# Patient Record
Sex: Female | Born: 1975 | Race: White | Hispanic: No | Marital: Married | Smoking: Never smoker
Health system: Southern US, Community
[De-identification: ages and names within clinical notes are randomized; demographics above are authoritative.]

## PROBLEM LIST (undated history)

## (undated) DIAGNOSIS — E109 Type 1 diabetes mellitus without complications: Secondary | ICD-10-CM

## (undated) DIAGNOSIS — E785 Hyperlipidemia, unspecified: Secondary | ICD-10-CM

## (undated) DIAGNOSIS — R51 Headache: Secondary | ICD-10-CM

## (undated) DIAGNOSIS — R519 Headache, unspecified: Secondary | ICD-10-CM

## (undated) HISTORY — DX: Type 1 diabetes mellitus without complications: E10.9

## (undated) HISTORY — DX: Headache: R51

## (undated) HISTORY — DX: Headache, unspecified: R51.9

## (undated) HISTORY — DX: Hyperlipidemia, unspecified: E78.5

---

## 1999-06-17 ENCOUNTER — Other Ambulatory Visit: Admission: RE | Admit: 1999-06-17 | Discharge: 1999-06-17 | Payer: Self-pay | Admitting: Gynecology

## 1999-12-23 ENCOUNTER — Other Ambulatory Visit: Admission: RE | Admit: 1999-12-23 | Discharge: 1999-12-23 | Payer: Self-pay | Admitting: Obstetrics and Gynecology

## 2000-01-19 ENCOUNTER — Encounter: Payer: Self-pay | Admitting: Obstetrics and Gynecology

## 2000-01-19 ENCOUNTER — Ambulatory Visit (HOSPITAL_COMMUNITY): Admission: RE | Admit: 2000-01-19 | Discharge: 2000-01-19 | Payer: Self-pay | Admitting: Obstetrics and Gynecology

## 2000-02-16 ENCOUNTER — Ambulatory Visit (HOSPITAL_COMMUNITY): Admission: RE | Admit: 2000-02-16 | Discharge: 2000-02-16 | Payer: Self-pay | Admitting: Obstetrics and Gynecology

## 2000-02-16 ENCOUNTER — Encounter: Payer: Self-pay | Admitting: Obstetrics and Gynecology

## 2000-06-22 ENCOUNTER — Inpatient Hospital Stay (HOSPITAL_COMMUNITY): Admission: AD | Admit: 2000-06-22 | Discharge: 2000-06-22 | Payer: Self-pay | Admitting: Obstetrics and Gynecology

## 2000-06-22 ENCOUNTER — Encounter: Payer: Self-pay | Admitting: Obstetrics and Gynecology

## 2000-06-23 ENCOUNTER — Encounter (INDEPENDENT_AMBULATORY_CARE_PROVIDER_SITE_OTHER): Payer: Self-pay

## 2000-06-23 ENCOUNTER — Inpatient Hospital Stay (HOSPITAL_COMMUNITY): Admission: AD | Admit: 2000-06-23 | Discharge: 2000-06-26 | Payer: Self-pay | Admitting: Obstetrics and Gynecology

## 2000-06-28 ENCOUNTER — Encounter: Admission: RE | Admit: 2000-06-28 | Discharge: 2000-09-26 | Payer: Self-pay | Admitting: Obstetrics and Gynecology

## 2000-12-25 ENCOUNTER — Other Ambulatory Visit: Admission: RE | Admit: 2000-12-25 | Discharge: 2000-12-25 | Payer: Self-pay | Admitting: Obstetrics and Gynecology

## 2002-01-15 ENCOUNTER — Other Ambulatory Visit: Admission: RE | Admit: 2002-01-15 | Discharge: 2002-01-15 | Payer: Self-pay | Admitting: Obstetrics and Gynecology

## 2002-08-02 ENCOUNTER — Encounter: Payer: Self-pay | Admitting: Obstetrics and Gynecology

## 2002-08-02 ENCOUNTER — Ambulatory Visit (HOSPITAL_COMMUNITY): Admission: RE | Admit: 2002-08-02 | Discharge: 2002-08-02 | Payer: Self-pay | Admitting: Obstetrics and Gynecology

## 2002-12-27 ENCOUNTER — Inpatient Hospital Stay (HOSPITAL_COMMUNITY): Admission: RE | Admit: 2002-12-27 | Discharge: 2002-12-30 | Payer: Self-pay | Admitting: Obstetrics and Gynecology

## 2003-02-07 ENCOUNTER — Other Ambulatory Visit: Admission: RE | Admit: 2003-02-07 | Discharge: 2003-02-07 | Payer: Self-pay | Admitting: Obstetrics and Gynecology

## 2004-02-27 ENCOUNTER — Other Ambulatory Visit: Admission: RE | Admit: 2004-02-27 | Discharge: 2004-02-27 | Payer: Self-pay | Admitting: Obstetrics and Gynecology

## 2005-03-14 ENCOUNTER — Other Ambulatory Visit: Admission: RE | Admit: 2005-03-14 | Discharge: 2005-03-14 | Payer: Self-pay | Admitting: Obstetrics and Gynecology

## 2005-12-19 ENCOUNTER — Encounter: Admission: RE | Admit: 2005-12-19 | Discharge: 2005-12-27 | Payer: Self-pay | Admitting: Obstetrics and Gynecology

## 2006-03-08 ENCOUNTER — Inpatient Hospital Stay (HOSPITAL_COMMUNITY): Admission: RE | Admit: 2006-03-08 | Discharge: 2006-03-11 | Payer: Self-pay | Admitting: Obstetrics and Gynecology

## 2007-10-19 ENCOUNTER — Encounter (INDEPENDENT_AMBULATORY_CARE_PROVIDER_SITE_OTHER): Payer: Self-pay | Admitting: Obstetrics and Gynecology

## 2007-10-19 ENCOUNTER — Inpatient Hospital Stay (HOSPITAL_COMMUNITY): Admission: RE | Admit: 2007-10-19 | Discharge: 2007-10-22 | Payer: Self-pay | Admitting: Obstetrics and Gynecology

## 2009-03-18 ENCOUNTER — Emergency Department (HOSPITAL_BASED_OUTPATIENT_CLINIC_OR_DEPARTMENT_OTHER): Admission: EM | Admit: 2009-03-18 | Discharge: 2009-03-18 | Payer: Self-pay | Admitting: Emergency Medicine

## 2009-03-19 ENCOUNTER — Emergency Department (HOSPITAL_BASED_OUTPATIENT_CLINIC_OR_DEPARTMENT_OTHER): Admission: EM | Admit: 2009-03-19 | Discharge: 2009-03-19 | Payer: Self-pay | Admitting: Emergency Medicine

## 2010-11-09 NOTE — Discharge Summary (Signed)
Ellen [name], Ellen [name]                 ACCOUNT NO.:  [id number]   MEDICAL RECORD NO.:  [id number]          PATIENT TYPE:  INP   LOCATION:  9148                          FACILITY:  [facility]   PHYSICIAN:  Zenaida Niece, M.D.DATE OF BIRTH:  1975/11/29   DATE OF ADMISSION:  10/19/2007  DATE OF DISCHARGE:  10/22/2007                               DISCHARGE SUMMARY   ADMISSION DIAGNOSES:  1. Intrauterine pregnancy at 39 weeks.  2. Diabetes.  3. Desires surgical sterility.   DISCHARGE DIAGNOSES:  1. Intrauterine pregnancy at 39 weeks.  2. Diabetes.  3. Desires surgical sterility.   PROCEDURES:  On October 19, 2007, she had repeat low transverse cesarean  section and bilateral partial salpingectomy.   HISTORY AND PHYSICAL:  Please see chart for full history and physical.  Briefly this is a 35 year old white female gravida 4, para 3-0-0-3 with  an EGA of [redacted] weeks, who presents for a repeat cesarean section.  She has  had three prior low transverse cesarean sections.  Pregnancy complicated  by diabetes with adequate control with insulin, and she desires tubal  ligation.  See chart for the remainder of her history.   PHYSICAL EXAMINATION:  Significant for weight of 200 pounds, gravid  abdomen with fundal height of 39.5 cm and a well-healed transverse scar.  The cervix is fingertip thick and -3 in vertex.   HOSPITAL COURSE:  The patient was admitted on the day of surgery,  underwent repeat cesarean section and tubal ligation under spinal  anesthesia.   ESTIMATED BLOOD LOSS:  1000 mL.   She delivered a viable female infant with Apgars of 9 and 9, and weighed 9  pounds and 12 ounces.  Postoperatively, she had no significant  complications.  Her blood sugars were well controlled with a reduced  insulin regimen.  Preoperative hemoglobin 11.5 and postoperative is  10.1.  On postoperative day #3, she was felt to be stable enough for  discharge home.  Her incision is healing well and her staples  were  removed, and Steri-Strips applied.   DISCHARGE INSTRUCTIONS:  Diabetic diet, pelvic rest, no strenuous  activity.  Follow up is in 2 weeks for an incision check.   MEDICATIONS:  1. Percocet, #30, one to two p.o. q. 4-6 hours p.r.n. pain and over-      the-counter ibuprofen as needed.  2. She is to take Lantus insulin 10 units subcu nightly and then she      is on Humalog regimen with meals, but she titrates herself.  She is      also given our discharge pamphlet.      Zenaida Niece, M.D.  Electronically Signed     TDM/MEDQ  D:  10/22/2007  T:  10/22/2007  Job:  [id number]

## 2010-11-09 NOTE — H&P (Signed)
Ellen [name], Ellen [name]                 ACCOUNT NO.:  [id number]   MEDICAL RECORD NO.:  [id number]          PATIENT TYPE:  INP   LOCATION:  NA                            FACILITY:  [facility]   PHYSICIAN:  Ellen [name], M.D.DATE OF BIRTH:  September 18, 1975   DATE OF ADMISSION:  10/19/2007  DATE OF DISCHARGE:                              HISTORY & PHYSICAL   CHIEF COMPLAINT:  Repeat cesarean section.   HISTORY OF PRESENT ILLNESS:  This is a 35 year old white female gravida  4, para 3-0-0-3 with an EGA of [redacted] weeks by an LMP consistent with an 8-  week ultrasound with a due date of Oct 26, 2007, who presents for repeat  cesarean section.  She has had three prior low transverse cesarean  sections and is undergoing repeat cesarean section.  Prenatal care has  been complicated by diabetes which has been requiring insulin.  She was  initially being followed by Dr. Talmage [name] but was not able to afford this  during pregnancy, so we have been following her.  During her pregnancy,  her sugars have been not optimally controlled but fairly adequately  controlled.  She has had reactive nonstress tests and no other  complications with the pregnancy.  She does wish to undergo tubal  ligation.   PRENATAL LABORATORY DATA:  Blood type is AB positive with a negative  antibody screen.  RPR nonreactive, rubella immune, hepatitis B surface  antigen negative, HIV negative, gonorrhea and chlamydia negative.  Cystic fibrosis negative.  Group B strep positive.   PAST OBSTETRICAL HISTORY:  Three cesarean sections at term, the first  one in 2001, that baby weighed 7 pounds 9 ounces, that was done for  nonreassuring fetal heart tracing.  She had oligohydramnios and  meconium.  The second C-section in 2004.  Baby weighed 9 pounds 12  ounces, that was a repeat and in 2007 another repeat at 39 weeks, 8  pounds 6 ounces.  She had gestational diabetes with the second pregnancy  but no other diabetes until this pregnancy.   Recent  diagnosis of diabetes for which she is on insulin.  She has a  remote history of pyelonephritis.   PAST SURGICAL HISTORY:  Cesarean section x3 and wisdom tooth removal.   ALLERGIES:  None known.   CURRENT MEDICATIONS:  Lantus 22 units daily as well as a kind of sliding  scale of Humalog currently with 6 units with breakfast, 10 at lunch and  12-14 with dinner.   SOCIAL HISTORY:  She is married and denies alcohol, tobacco or drug use.   PHYSICAL EXAMINATION:  GENERAL APPEARANCE:  This is a well-developed,  gravid female in no acute distress.  Weight is 200 pounds.  NECK:  Supple without lymphadenopathy or thyromegaly.  LUNGS:  Clear to auscultation.  HEART:  Regular rate and rhythm without murmur.  ABDOMEN:  Gravid with fundal height of 39.5 cm and a well-healed  transverse scar.  EXTREMITIES:  Extremities have 1+ edema and are nontender.  PELVIC:  Cervix is fingertip, thick and -3 vertex presentation.   ASSESSMENT:  1. Intrauterine  pregnancy at 39 weeks with three previous cesarean      sections.  The patient is being admitted for repeat cesarean      section.  Surgery and all risks of surgery have been discussed and      she understands.  2. Desires surgical sterility.  She understands that this is a      permanent procedure with a 1/150 to 1/200 failure rate.  3. Diabetes under adequate control on insulin.   PLAN:  Admit the patient on the day of surgery for repeat cesarean  section and tubal ligation.      Ellen [name], M.D.  Electronically Signed     TDM/MEDQ  D:  10/18/2007  T:  10/18/2007  Job:  [id number]

## 2010-11-09 NOTE — Op Note (Signed)
Ellen [name], Ellen [name]                 ACCOUNT NO.:  [id number]   MEDICAL RECORD NO.:  [id number]          PATIENT TYPE:  INP   LOCATION:  9199                          FACILITY:  [facility]   PHYSICIAN:  Leighton Roach Meisinger, M.D.DATE OF BIRTH:  1976/04/07   DATE OF PROCEDURE:  10/19/2007  DATE OF DISCHARGE:                               OPERATIVE REPORT   PREOPERATIVE AND POSTOPERATIVE DIAGNOSIS:  Intrauterine pregnancy at 39  weeks, previous cesarean section x3 and desires surgical sterility.   PROCEDURE:  Repeat low transverse cesarean section and bilateral partial  salpingectomy.   SURGEON:  Zenaida Niece, M.D.   ASSISTANT:  Tracey Harries, MD   ANESTHESIA:  Epidural.   FINDINGS:  She had abdominal wall scarring from her prior cesarean  sections.  She delivered a viable female infant with Apgars of 9 and 9  that weighed 9 pounds 12 ounces.   SPECIMENS:  Placenta sent for cord blood collection and then to  pathology.   ESTIMATED BLOOD LOSS:  1000 mL.   COMPLICATIONS:  None.   PROCEDURE IN DETAIL:  The patient was taken to the operating room and  placed in the sitting position.  Dr. Tacy Dura instilled spinal anesthesia  and she was then placed in the dorsal supine position with left lateral  tilt.  Abdomen was then prepped and draped in the usual sterile fashion  and a Foley catheter inserted.  The level of her anesthesia was found to  be adequate and abdomen was entered via a standard Pfannenstiel's  incision through her previous scar.  There was noted to be fair amount  of abdominal wall scarring and this was taken down carefully in layers.  Once the peritoneal cavity was entered and the lower uterine segment was  exposed, an Alexis disposable self-retaining retractor was placed.  This  achieved good exposure of the lower uterine segment.  A 4 cm transverse  incision was made in a fairly thin lower uterine segment.  The incision  was extended digitally.  Membranes were ruptured  revealing clear fluid.  The fetal vertex was brought to the incision.  It was very difficult to  deliver just due to the size of the head.  I tried the Kiwi vacuum twice  for assistance and another vacuum once for assistance and they did not  achieve adequate suction and all popped off with moderate traction.  The  Alexis retractor was removed and the rectus muscles on the patient's  right side were divided to achieve better exposure.  Once this was done,  the vertex was delivered.  Mouth and nares were suctioned.  A loose  nuchal cord times was reduced.  The remainder of the infant then  delivered atraumatically.  Cord was doubly clamped and cut and the  infant handed to the awaiting pediatric team.  The placenta delivered  spontaneously and was sent for cord blood collection.  Uterus was wiped  dry with a clean lap pad, all clots and debris removed.  Uterine  incision was inspected and found to be free of extensions.  Uterine  incision was  closed in one layer being a running locking layer with #1  chromic with adequate hemostasis.  Tubes and ovaries were inspected and  found to be normal.  Uterine incision was again inspected and found to  be hemostatic.  Attention was turned to tubal ligation.  Both fallopian  tubes and ovaries were normal.  Both fallopian tubes were identified and  traced to their fimbriated ends.  The middle portion of each fallopian  tube was elevated with a Babcock clamp.  A window was made in an  avascular portion of the mesosalpinx with electrocautery.  Zero plain  gut suture was passed through this window, tied proximally and wrapped  around distally to create a knuckle of tube.  On both sides these  knuckles of tubes were removed sharply.  A small amount of bleeding from  one of the stumps on the patient's right side was controlled with  electrocautery.  Other than that, the stumps were hemostatic and tubal  ostia were identified.  Uterine incision was again  inspected and found  to be hemostatic.  Subfascial space was then irrigated and made  hemostatic with electrocautery.  Rectus muscles were reapproximated in  the midline with running number 1 chromic which also reapproximated the  rectus muscle on the right that had been cut.  Fascia was then closed in  running fashion starting at both ends and meeting in the middle with 0  Vicryl.  Subcutaneous tissue was irrigated and made hemostatic with  electrocautery.  Subcutaneous tissue was then closed with running 2-0  plain gut suture.  Skin was then closed with staples followed by a  sterile dressing.  The patient tolerated the procedure well.  She was  taken to the recovery room in stable condition.  Counts were correct x2,  she was given Ancef 1 gram IV at the beginning of the procedure and had  PAS hose on throughout the procedure.      Zenaida Niece, M.D.  Electronically Signed     TDM/MEDQ  D:  10/19/2007  T:  10/19/2007  Job:  [id number]

## 2010-11-12 NOTE — Op Note (Signed)
Ellen [name], Ellen [name]                             ACCOUNT NO.:  [id number]   MEDICAL RECORD NO.:  [id number]                   PATIENT TYPE:  INP   LOCATION:  9145                                 FACILITY:  [facility]   PHYSICIAN:  Zenaida Niece, M.D.             DATE OF BIRTH:  02-Nov-1975   DATE OF PROCEDURE:  12/27/2002  DATE OF DISCHARGE:                                 OPERATIVE REPORT   PREOPERATIVE DIAGNOSES:  1. Intrauterine pregnancy at 39 weeks.  2. Previous cesarean section.   POSTOPERATIVE DIAGNOSES:  1. Intrauterine pregnancy at 39 weeks.  2. Previous cesarean section.  3. Macrosomia.   OPERATION/PROCEDURE:  Repeat low transverse cesarean section without  extensions.   SURGEON:  Zenaida Niece, M.D.   ASSISTANT:  Malachi Pro. Ambrose Mantle, M.D.   ANESTHESIA:  Spinal.   ESTIMATED BLOOD LOSS:  800 mL.   FINDINGS:  The patient had normal anatomy with some filmy adhesions around  the left tube and ovary.  She delivered a viable female infant with Apgars  of 8 and 9, weight 9 pounds 12 ounces.   DESCRIPTION OF PROCEDURE:  The patient was taken to the operating room and  placed in the sitting position.  Dr. Tacy Dura instilled spinal anesthesia and  she was placed in the dorsal supine position with a left lateral tilt.  Her  abdomen was prepped and draped in the usual sterile fashion and a Foley  catheter inserted.  The level of her anesthesia was found to adequate and  her abdomen was entered via her previous Pfannenstiel incision.  There was a  small amount of scarring between the fascia and rectus muscles.  The  peritoneum was entered in layers being careful to avoid bowel and bladder.  Bladder blade was placed and vesicouterine peritoneum was incised and  bladder flap created digitally.  A 4 cm transverse incision was made in the  lower uterine segment which was fairly thin.  The uterine incision was then  extended bilaterally digitally.  Membranes were ruptured revealing  clear  fluid.  Fetal vertex was then delivered to the incision atraumatically.  Mouth and nares were suctioned.  The remainder of the infant delivered  atraumatically, the cord was doubly clamped and cut and the infant handed to  the waiting pediatric team.  Cord blood and a segment of cord for gas were  obtained.  Placenta delivered spontaneously.  Uterus was wiped dry with a  clean lap pad and all clots and debris were removed.  Cervix was dilated  with a long ring forceps.  Uterine incision was inspected and found to be  free of extensions.  Uterine incision was closed in one layer being running  locking layer with #1 chromic with adequate hemostasis.  Bleeding from  serosal edges was controlled with electrocautery.  Tubes and ovaries were  inspected and found to have some filmy adhesions around  the left tube and  ovary but were otherwise normal.  Uterine was again inspected and found to  be hemostatic.  Subfascial space was irrigated and made hemostatic with  electrocautery.  Fascia was closed in a running fashion starting at both  ends and meeting in the middle with 0 Vicryl.  Subcutaneous tissue was  irrigated and made hemostatic with electrocautery.  Subcutaneous was closed with running 2-0 plain gut suture.  Skin was then  closed with staples and a sterile dressing.  The patient tolerated the  procedure well and was taken to the recovery room in stable condition.  Counts were correct and she was given Ancef 1 g after cord clamp.                                                 Zenaida Niece, M.D.    TDM/MEDQ  D:  12/27/2002  T:  12/27/2002  Job:  [id number]

## 2010-11-12 NOTE — Discharge Summary (Signed)
   Ellen [name], Ellen [name]                             ACCOUNT NO.:  [id number]   MEDICAL RECORD NO.:  [id number]                   PATIENT TYPE:  INP   LOCATION:  9145                                 [facility]ACILITY[facility]   PHYSICIAN:  Zenaida Niece, M.D.             DATE OF BIRTH:  [birth year]   DATE OF ADMISSION:  12/27/2002  DATE OF DISCHARGE:  12/30/2002                                 DISCHARGE SUMMARY   ADMISSION DIAGNOSES:  1. Intrauterine pregnancy at 39 weeks.  2. Previous cesarean section.   DISCHARGE DIAGNOSES:  1. Intrauterine pregnancy at 39 weeks.  2. Previous cesarean section.  3. Fetal macrosomia.   PROCEDURES:  Repeat low transverse cesarean section.   HISTORY AND PHYSICAL:  Please see chart for full history and physical.  Briefly, this is a 35 year old white female gravida 2 para 1-0-0-1 with an  EGA of [redacted] weeks with a previous low transverse cesarean section who is  admitted for repeat cesarean section.  Prenatal care essentially  uncomplicated.  Significant physical exam:  Abdomen is benign, gravid, with  a fundal height of 39 cm and a transverse incision.  Cervix is 1, 30, and -3  and vertex.   HOSPITAL COURSE:  The patient was admitted on the day of surgery and  underwent a repeat low transverse cesarean section without extensions under  spinal anesthesia.  Estimated blood loss was 800 mL.  The patient had normal  anatomy with a few filmy adhesions around the left tube and ovary.  She  delivered a viable female infant with Apgars of 8 and 9 that weighed 9  pounds 12 ounces.  Postoperatively she was rapidly able to ambulate and  tolerate a regular diet and had no complications.  Predelivery hemoglobin  12.1; postdelivery 9.9.  She breast fed her baby without problems.  On the  morning of postoperative day #3 she was felt to be stable enough for  discharge home.  Incision was healing well and staples were removed and  Steri-Strips applied.   DISCHARGE  INSTRUCTIONS:  1. Regular diet.  2. Pelvic rest and no strenuous activity.  3. Follow-up is in approximately 10 days for an incision check.  4. Medications:     a. Percocet 5/500 #30 one to two p.o. q.4-6h. p.r.n. pain.     b. Ibuprofen 600 mg one p.o. q.6h. p.r.n.     c. She is also to take over-the-counter Peri-Colace as directed.  5. She was also given our discharge pamphlet.                                               Zenaida Niece, M.D.    TDM/MEDQ  D:  12/30/2002  T:  12/30/2002  Job:  [id number]

## 2010-11-12 NOTE — H&P (Signed)
Ellen [name], Ellen [name]                             ACCOUNT NO.:  [id number]   MEDICAL RECORD NO.:  [id number]                   PATIENT TYPE:  INP   LOCATION:  NA                                   [facility]   PHYSICIAN:  Zenaida Niece, M.D.             DATE OF BIRTH:  10/01/1975   DATE OF ADMISSION:  12/27/2002  DATE OF DISCHARGE:                                HISTORY & PHYSICAL   CHIEF COMPLAINT:  Repeat cesarean section.   HISTORY OF PRESENT ILLNESS:  This is a 35 year old white female, gravida 2,  para 1-0-0-1, with an EGA of [redacted] weeks, by a LMP consistent with a nine week  ultrasound with a due date of January 02, 2003, who presents for repeat cesarean  section.  She had a prior low transverse cesarean section for  oligohydramnios, meconium fluid, and non-reassuring fetal tracing.  She also  has a narrow pubic arch.  She has elected to undergo a repeat cesarean  section.   PRENATAL CARE:  Prenatal care has been essentially uncomplicated.  Due to  her history of oligohydramnios, she did have an AFI checked on December 20, 2002, and this was normal at 19.4.   PRENATAL LABORATORY DATA:  Blood type is ABDOMEN positive with a negative  antibody screen.  RPR nonreactive.  Rubella immune.  Hepatitis B surface  antigen negative.  Gonorrhea and Chlamydia negative.  Triple screen was  declined.  One hour Glucola was 177, three hour GTT was 87, 157, 152, and  84.  Group B Strep is positive.   PAST OBSTETRICAL HISTORY:  In December 2001, she had a low transverse  cesarean section, baby weighed 7 pounds 9 ounces, and this was for  oligohydramnios, meconium, and non-reassuring fetal tracing.   PAST MEDICAL HISTORY:  Remote history of urinary tract infections.   PAST SURGICAL HISTORY:  1. Wisdom teeth extraction.  2. Cesarean section.   ALLERGIES:  None known.   CURRENT MEDICATIONS:  None.   SOCIAL HISTORY:  The patient is married and denies alcohol, tobacco, or drug  use.   FAMILY HISTORY:  Noncontributory.   PHYSICAL EXAMINATION:  VITAL SIGNS:  Weight is 205 pounds.  Blood pressure  is 130/90.  GENERAL:  This is a well-developed, well-nourished gravid female in no acute  distress.  NECK:  Supple without lymphadenopathy or thyromegaly.  LUNGS:  Clear to auscultation.  HEART:  Regular rate and rhythm without murmur.  ABDOMEN:  Gravid, nontender, with a fundal height of 39 cm, and a well-  healed transverse incision.  EXTREMITIES:  Trace edema, nontender.  PELVIC:  Vaginal exam:  Cervix is 1, 30, -3, vertex presentation.   ASSESSMENT:  1. Intrauterine pregnancy at 39 weeks.  2. Previous cesarean section.   The patient is cleared for a trial of labor, but wishes to undergo repeat  cesarean section.  Risks of surgery including  bleeding, infection, and  damage to surrounding organs have been discussed with the patient.   PLAN:  Admit the patient on December 27, 2002, for a repeat cesarean section.                                               Zenaida Niece, M.D.    TDM/MEDQ  D:  12/26/2002  T:  12/26/2002  Job:  [id number]

## 2010-11-12 NOTE — Discharge Summary (Signed)
[facility] of [facility]  Patient:    Ellen [name], Ellen [name]                          MRN: [id number] Adm. Date:  [id number] Disc. Date: [id number] Attending:  Oliver Pila                           Discharge Summary  ADMISSION DIAGNOSES:          1. Intrauterine pregnancy at 40 weeks.                               2. Oligohydramnios.  DISCHARGE DIAGNOSES:          1. Intrauterine pregnancy at 40 weeks.                               2. Oligohydramnios.                               3. Non-reassuring fetal heart tracing.                               4. Meconium-stained amniotic fluid.  PROCEDURE:                    Primary low transverse cesarean section                               without extension.  COMPLICATIONS:                None.  CONSULTATIONS:                None.  HISTORY OF PRESENT ILLNESS:   This is a 35 year old white female gravida 1, para 0, with an EGA of [redacted] weeks by 11-week ultrasound with an EDC of December 27, who presents for induction due to decreased fetal movement and a decreased AFI of 5.4 and a favorable cervix.  She is having some contractions, no bleeding, and no rupture of membranes.  Prenatal care essentially otherwise uncomplicated.  PRENATAL LABORATORY DATA:     Blood type is AB positive with a negative antibody screen.  RPR nonreactive.  Rubella immune.  Hepatitis B surface antigen negative.  HIV negative.  Gonorrhea and Chlamydia negative.  One-hour Glucola 95.  Group B strep is negative.  PAST MEDICAL HISTORY:         Significant for a remote history of pyelonephritis and wisdom teeth removal.  PHYSICAL EXAMINATION:  VITAL SIGNS:                  She was afebrile with stable vital signs.  Fetal heart tracing initially had a baseline of approximately 155 with slightly decreased variability and decreased accelerations with possible late decelerations, with contractions every three to four minutes.  ABDOMEN:                       Gravid, nontender, with an estimated fetal weight of 7-1/2 to 8 pounds.  Vaginal exam was 3, 70, and -1, with a vertex presentation and an adequate pelvis.  HOSPITAL COURSE:  The patient was admitted and was noted to have the above-mentioned fetal heart tracing.  She was placed on low-dose Pitocin for a contraction stress test to see how the baby tolerated labor.  On my first exam, I was able to perform artificial rupture of membranes, which revealed clear amniotic fluid, and internal monitors were placed.  She did receive two amnioinfusions for variable decelerations.  She progressed to 5 cm, at which point the fetal heart tracing had significant decreased variability, no accelerations, and occasional deep variables and frequent late decelerations.  At this time we decided to proceed with a C-section for a nonreassuring fetal heart tracing.  On the evening of December 28, she had a primary low transverse cesarean section, at which point meconium-stained amniotic fluid was noted.  C-section was performed under epidural anesthesia with an estimated blood loss of 800 cc.  She delivered a viable female infant with Apgars of 7 and 8 that weighed 7 pounds 9 ounces and had an arterial cord pH of 7.11.  Postoperatively she did very well, remained afebrile, and was rapidly able to ambulate and tolerate a regular diet.  Preoperative hemoglobin 13.0, postoperative was 10.4.  On the evening of postoperative day #2, she had some itching from the tape where her epidural was, and this was decreased with oral Benadryl.  On the morning of postoperative day #3, she was felt to be stable enough to discharge home.  Her subcuticular Prolene suture was removed, and her Steri-Strips were left intact.  CONDITION ON DISCHARGE:       Stable.  DISPOSITION:                  Discharged to home.  DISCHARGE INSTRUCTIONS:       Her diet is regular.  Her activity is pelvic rest, no strenuous activity,  and no driving.  Her follow-up is in 10-14 days for an incision check.  She was given a discharge pamphlet.  DISCHARGE MEDICATIONS:        Percocet p.r.n. pain, and over-the-counter Motrin and Benadryl p.r.n. DD:  06/26/00 TD:  06/26/00 Job: [id number]

## 2010-11-12 NOTE — H&P (Signed)
[name], Ellen [name]                 ACCOUNT NO.:  [id number]   MEDICAL RECORD NO.:  [id number]          PATIENT TYPE:  INP   LOCATION:  NA                            FACILITY:  [facility]   PHYSICIAN:  Zenaida Niece, M.D.DATE OF BIRTH:  1976-02-24   DATE OF ADMISSION:  03/08/2006  DATE OF DISCHARGE:                                HISTORY & PHYSICAL   CHIEF COMPLAINT:  Repeat cesarean section.   HISTORY OF PRESENT ILLNESS:  This is a 35 year old, white female, gravida 3,  para 2-0-0-2, with an EGA of [redacted] weeks by an LMP consistent with an 8 week  ultrasound with a due date of September 21, who presents for repeat cesarean  section.  The patient has had 2 prior cesarean sections and declines trial  of labor and wishes to undergo repeat cesarean section.  Prenatal care has  been complicated by an early diagnosis of gestational diabetes which is  probably consistent with type 2 diabetes which has been fairly well  controlled with Glyburide and insulin.   PRENATAL LABS:  Blood type is AB positive with a negative antibody screen,  RPR nonreactive, rubella immune, hepatitis B surface antigen negative,  gonorrhea and Chlamydia negative.  Initial 1 hour Glucola was 405, after  which she was sent to the nutrition management center and group B strep is  negative.   PAST OBSTETRIC HISTORY:  1. In 2001, primary cesarean section for oligohydramnios and meconium.  At      40 weeks, baby weighed 7 pounds 9 ounces.  2. In 2004, repeat cesarean section for a nonreassuring fetal heart      tracing and fetal macrosomia at 39 weeks, baby weighed 9 pounds 12      ounces.  She did have borderline gestational diabetes with her second      pregnancy.   PAST MEDICAL HISTORY:  Remote history of pyelonephritis.   PAST SURGICAL HISTORY:  Cesarean section x2 and wisdom teeth extraction.   ALLERGIES:  None known.   CURRENT MEDICATIONS:  1. Glyburide 10 mg p.o. q.a.m.  2. Lantus insulin 28 units subcu q.h.s.  and Humulog insulin 8 units subcu      with dinner.   SOCIAL HISTORY:  The patient is married and denies alcohol, tobacco, or drug  use.   FAMILY HISTORY:  Noncontributory.   PHYSICAL EXAMINATION:  VITAL SIGNS:  Weight is 188 pounds, blood pressure  110/70.  GENERAL:  This is a gravid white female in no acute distress.  NECK:  Supple without lymphadenopathy or thyromegaly.  LUNGS:  Clear to auscultation.  HEART:  Regular rate and rhythm without murmur.  ABDOMEN:  Gravid, nontender, with well-healed transverse scar and a fundal  height of 37 cm.  EXTREMITIES:  1+ edema and are nontender.  PELVIC:  Cervix is fingertip, thick, and high with a vertex presentation.   ASSESSMENT:  1. Intrauterine pregnancy at 39 weeks.  2. Previous cesarean section x2 and the patient wishes to proceed with      repeat cesarean section.  All risks of surgery have been discussed, and  she agrees and wishes to proceed.  3. Probably type 2 diabetes which has been fairly well controlled with      numerous medications.   PLAN:  Admit the patient on the day of surgery for a repeat cesarean  section.  She is not to take her Lantus insulin the night prior to surgery.      Zenaida Niece, M.D.  Electronically Signed     TDM/MEDQ  D:  03/07/2006  T:  03/07/2006  Job:  [id number]

## 2010-11-12 NOTE — Op Note (Signed)
[facility] of [facility]  Patient:    Ellen [name], Ellen [name]                          MRN: [id number] Proc. Date: 06/23/00 Adm. Date:  [id number] Attending:  Michaele Offer                           Operative Report  PREOPERATIVE DIAGNOSES:       1. Intrauterine pregnancy at 40 weeks.                               2. Oligohydramnios.                               3. Nonreassuring fetal heart tracing.  POSTOPERATIVE DIAGNOSES:      1. Intrauterine pregnancy at 40 weeks.                               2. Oligohydramnios.                               3. Nonreassuring fetal heart tracing.                               4. Meconium-stained amniotic fluid.  PROCEDURE:                    Primary low transverse cesarean section without                               extension.  SURGEON:                      Zenaida Niece, M.D.  ANESTHESIA:                   Epidural.  ESTIMATED BLOOD LOSS:         800 cc.  CHEMOPROPHYLAXIS:             Cefotan 2 gm after cord clamp.  FINDINGS:                     Patient had normal female anatomy and delivered a viable female infant with Apgars of 7 and 8 that weighed 7 pounds 9 ounces and had an arterial cord pH of 7.11.  COUNTS:                       Correct.  CONDITION:                    Stable.  DESCRIPTION OF PROCEDURE:     After appropriate informed consent was obtained, the patient was taken to the operating room and placed in the dorsal supine position with left lateral tilt. Her previously placed epidural was dosed appropriately and her abdomen was prepped and draped in the usual sterile fashion. The level of her anesthesia was found to be adequate and her abdomen was entered via a standard Pfannenstiel incision. The vesicouterine peritoneum was incised and a bladder flap created digitally. A 4-cm  transverse incision was made in the lower uterine segment and light meconium fluid noted upon entering the amniotic cavity.  The incision was extended bilaterally digitally. The fetal vertex was grasped and delivered through the incision atraumatically. The mouth and nares were suctioned with Delee suction and with the bulb with return of a small amount of mucus and light meconium fluid. The remainder of the infant was then delivered atraumatically. The cord was doubly clamped and cut and the infant handed to the awaiting pediatric team. Cord blood and cord gas were obtained and the placenta delivered spontaneously. The uterus was wiped dry with a clean lap pad and the incision inspected and found to be free of extensions. The uterine incision was closed in one layer being a running locking layer with #1 chromic with adequate hemostasis. Bleeding from serosal edges was controlled with electrocautery. Both paracolic gutters were blotted and all clots and debris removed and both tubes and ovaries were inspected and found to be normal. The uterine incision was again inspected and found to be hemostatic. The subfascial space was irrigated and made hemostatic with electrocautery. The fascia was then closed in a running fashion starting at both ends and meeting in the middle with 0 Vicryl. The subcutaneous tissue was then irrigated and made hemostatic with electrocautery. The skin was closed with running subcuticular suture of 4-0 Prolene followed by Steri-Strips and a bandage. Patient tolerated the procedure well and was taken to the recovery room in stable condition. D:  06/23/00 TD:  06/23/00 Job: [id number]

## 2010-11-12 NOTE — Op Note (Signed)
Ellen [name], Ellen [name]                 ACCOUNT NO.:  [id number]   MEDICAL RECORD NO.:  [id number]          PATIENT TYPE:  INP   LOCATION:  9113                          FACILITY:  [facility]   PHYSICIAN:  Zenaida Niece, M.D.DATE OF BIRTH:  27-Nov-1975   DATE OF PROCEDURE:  03/08/2006  DATE OF DISCHARGE:                                 OPERATIVE REPORT   PREOPERATIVE DIAGNOSES:  1. Intrauterine pregnancy at 39 weeks  2. Previous cesarean section x2.  3. Type 2 diabetes   POSTOPERATIVE DIAGNOSES:  1. Intrauterine pregnancy at 39 weeks  2. Previous cesarean section x2.  3. Type 2 diabetes.   PROCEDURE:  Repeat low transverse cesarean section.   SURGEON:  Zenaida Niece, M.D.   ASSISTANT:  Sherron Monday, M.D.   ANESTHESIA:  Spinal.   SPECIMENS SENT:  Placenta sent for cord blood collection and then to labor  and delivery.   ESTIMATED BLOOD LOSS:  800 mL.   COMPLICATIONS:  None.   FINDINGS:  The patient had normal anatomy with minimal scar tissue from her  prior surgeries.  She delivered a viable female infant with Apgars of 9 and  9, weight 8 pounds 6 ounces.   PROCEDURE IN DETAIL:  The patient was taken to the operating room and placed  in the sitting position.  Dr. Malen Gauze instilled spinal anesthesia, and she  was placed in the dorsal supine position with left lateral tilt.  Abdomen  was prepped and draped in the usual sterile fashion, and a Foley catheter  was inserted.  The level of her anesthesia was found to be adequate, and  abdomen was then entered via her previous transverse scar in a standard  Pfannenstiel's manner.  The bladder blade was placed, and a 4-cm transverse  incision was made in the lower uterine segment which was fairly thin.  Once  the uterine cavity was entered, the incision was extended bilaterally  digitally.  Membranes were ruptured revealing clear amniotic fluid.  The  fetal vertex was grasped and delivered through the incision atraumatically.  Mouth  and nares were suctioned.  The remainder of the infant then delivered  atraumatically.  Cord was doubly clamped and cut, and the infant handed to  the awaiting pediatric team.  I was not able to obtain adequate blood from  the umbilical vein as it was very difficult to identify.  The placenta was  delivered spontaneously and was sent for cord blood collection and then to  labor and delivery.  Uterus was wiped dry with a clean lap pad, and all  clots and debris removed.  Uterine incision was inspected and found to be  free of extensions.  The bladder was pushed inferior.  Uterine incision was  closed in one layer being a running locking layer with #1 chromic with good  hemostasis.  A small amount of bleeding from the right side was controlled  with a figure-of-eight suture of #1 chromic.  Tubes and ovaries were  inspected and found to be normal.  Paracolic gutters were blotted of all  clots and debris.  Uterine  incision was again inspected and found to be  hemostatic.  Subfascial space was irrigated and made hemostatic with  electrocautery.  Fascia was then closed in a running fashion starting at  both ends and meeting in the middle with 0 Vicryl.  Subcutaneous tissue was  then irrigated and made hemostatic with electrocautery.  Subcutaneous tissue  was then closed with running 2-0 plain gut suture.  Skin was closed with  staples followed by a sterile dressing.  The patient tolerated the procedure  well.  She was taken to recovery in stable condition.  Of note, there was  some minimal hematuria noted at the end of the case.  The counts were  correct x2, and she received Ancef 1 g after cord clamp.      Zenaida Niece, M.D.  Electronically Signed     TDM/MEDQ  D:  03/08/2006  T:  03/08/2006  Job:  [id number]

## 2010-11-12 NOTE — Discharge Summary (Signed)
Ellen [name], Ellen [name]                 ACCOUNT NO.:  [id number]   MEDICAL RECORD NO.:  [id number]          PATIENT TYPE:  INP   LOCATION:  9113                          FACILITY:  [facility]   PHYSICIAN:  Sherron Monday, MD        DATE OF BIRTH:  1976-02-13   DATE OF ADMISSION:  03/08/2006  DATE OF DISCHARGE:  03/11/2006                                 DISCHARGE SUMMARY   ADMISSION DIAGNOSES:  1. Intrauterine pregnancy at term.  2. History of low transverse cesarean section x2.  3. Type 2 diabetes.  4. Repeat cesarean section planned.   DISCHARGE DIAGNOSES:  1. Intrauterine pregnancy at term.  2. History of low transverse cesarean section x2.  3. Type 2 diabetes.  4. Repeat cesarean section, delivered.   HISTORY OF PRESENT ILLNESS:  Ms. Durfee is a 35 year old Caucasian female,  G3, P2-0-0-2, with an estimated gestational age of [redacted] weeks by an LMP  consistent with an 8-week ultrasound with an [facility] of March 08, 2006, who  presented for repeat cesarean section.  She had two prior cesarean sections  and declines a trial of labor, and wishes to undergo repeat cesarean.  Prenatal care was complicated by an early diagnosis of gestational diabetes,  more than likely consistent with type 2 diabetes, which has been fairly well  controlled with glyburide and insulin.   PAST MEDICAL HISTORY:  Remote history of pyelonephritis.   PAST SURGICAL HISTORY:  1. Cesarean section x2.  2. Wisdom teeth extraction.   PAST OB/GYN HISTORY:  G1 was in 2001.  Primary section for oligohydramnios  and meconium.  G2 was in 2004, repeat cesarean section.  Non-reassuring  fetal heart tracing.  Fetal macrosomia at 39 weeks.  Fetal weight of 9  pounds 12 ounces.  Borderline gestational diabetes.  G3 is the current  pregnancy.   MEDICATIONS:  1. Glyburide 10 mg orally a.m.  2. Lantus 28 units subcu at bedtime.  3. Humalog a.m. and subcu with dinner.   ALLERGIES:  EPIDURAL MEDICINE, SHE IS UNSURE OF THE NAME.   SOCIAL HISTORY:  She is married and denies alcohol, tobacco or drug use.   PRENATAL LABORATORY DATA:  She is AB positive, antibody screen negative.  RPR nonreactive.  Rubella immune.  Hepatitis B surface antigen negative.  Gonorrhea and chlamydia negative.  Glucola 405 and group B strep was  negative.   HOSPITAL COURSE: She was admitted on the morning of the 12th to have her C-  section which she underwent without complications, delivering a viable  female infant with Apgar's of 9 at one minute and 9 at five minutes, with a  weight of 8 pounds and 6 ounces.  Her postpartum course was relatively  uncomplicated.  Her hemoglobin decreased from 12.3 to 10.8, consistent with  delivery.  Her finger-stick blood sugars were closely monitored and noted to  be from 69 fasting to low 200s postprandial.  She was restarted on part of  her home regimen.  She will be taking 5 of glyburide daily  and will be  monitoring her sugars.  She will come to the office in 2 weeks for an  incision check and we will review these sugars and decide if we need to  adjust any of her antiglycemic.   PLAN:  She will have a postpartum checkup in 6 weeks.  She plans to use  condoms and birth control pills after her 6-week checkup for contraception.  She was discharged to home with prescription for Vicodin, Motrin and  prenatal vitamins as well as for the glyburide.  She was also discharged  home with routine discharge instructions which she voiced understanding to  and numbers to call with any questions or problems.      Sherron Monday, MD  Electronically Signed     JB/MEDQ  D:  03/11/2006  T:  03/13/2006  Job:  [id number]

## 2011-03-22 LAB — BASIC METABOLIC PANEL
Calcium: 8.4
Chloride: 102
Creatinine, Ser: 0.61
GFR calc Af Amer: >60
Sodium: 134 — ABNORMAL LOW

## 2011-03-22 LAB — CBC
HCT: 33.3 — ABNORMAL LOW
Hemoglobin: 11.5 — ABNORMAL LOW
MCHC: 34.7
MCV: 85.3
Platelets: 195
Platelets: 222
RBC: 3.37 — ABNORMAL LOW
RBC: 3.9
RDW: 13.1
WBC: 10.3
WBC: 8

## 2011-03-22 LAB — CCBB MATERNAL DONOR DRAW

## 2011-03-22 LAB — RPR: RPR Ser Ql: NONREACTIVE

## 2013-03-13 ENCOUNTER — Other Ambulatory Visit: Payer: Self-pay | Admitting: *Deleted

## 2013-03-13 MED ORDER — INSULIN GLARGINE 100 UNIT/ML SOLOSTAR PEN: PEN_INJECTOR | SUBCUTANEOUS | Status: DC

## 2013-03-21 LAB — BASIC METABOLIC PANEL
BUN: 14 mg/dL (ref 4–21)
Creatinine: 0.7 mg/dL (ref <=1.1)
Glucose: 156 mg/dL
Potassium: 4.7 mmol/L (ref 3.4–5.3)
Sodium: 140 mmol/L (ref 137–147)

## 2013-03-21 LAB — LIPID PANEL
CHOLESTEROL: 195 mg/dL (ref 0–200)
TRIGLYCERIDES: 41 mg/dL (ref 40–160)

## 2013-03-21 LAB — HEPATIC FUNCTION PANEL
ALT: 15 U/L (ref 7–35)
AST: 14 U/L (ref 13–35)

## 2013-07-03 ENCOUNTER — Ambulatory Visit: Payer: Self-pay | Admitting: Internal Medicine

## 2013-07-30 ENCOUNTER — Encounter: Payer: Self-pay | Admitting: Internal Medicine

## 2013-07-31 ENCOUNTER — Telehealth: Payer: Self-pay

## 2013-07-31 NOTE — Telephone Encounter (Signed)
Pt left v/m; pt has re establish appt on 08/02/13 and pt wants to verify records from Dr Birdie RiddleStephen Fuller [facility]. Pt request cb.

## 2013-08-02 ENCOUNTER — Telehealth: Payer: Self-pay | Admitting: Internal Medicine

## 2013-08-02 ENCOUNTER — Encounter: Payer: Self-pay | Admitting: Internal Medicine

## 2013-08-02 ENCOUNTER — Ambulatory Visit (INDEPENDENT_AMBULATORY_CARE_PROVIDER_SITE_OTHER): Payer: 59 | Admitting: Internal Medicine

## 2013-08-02 VITALS — BP 102/66 | HR 90 | Temp 98.4°F | Ht 65.0 in | Wt 164.0 lb

## 2013-08-02 DIAGNOSIS — IMO0002 Reserved for concepts with insufficient information to code with codable children: Secondary | ICD-10-CM | POA: Insufficient documentation

## 2013-08-02 DIAGNOSIS — E109 Type 1 diabetes mellitus without complications: Secondary | ICD-10-CM

## 2013-08-02 DIAGNOSIS — E1065 Type 1 diabetes mellitus with hyperglycemia: Secondary | ICD-10-CM | POA: Insufficient documentation

## 2013-08-02 NOTE — Assessment & Plan Note (Signed)
Follows with Dr. Lafe GarinGherge On lantus and novolog

## 2013-08-02 NOTE — Progress Notes (Signed)
Pre-visit discussion using our clinic review tool. No additional management support is needed unless otherwise documented below in the visit note.  

## 2013-08-02 NOTE — Patient Instructions (Signed)

## 2013-08-02 NOTE — Progress Notes (Signed)
HPI  Pt presents to the clinic today to establish care. She is transferring care from Dr. Toy Cookey. She has no concerns today.  Past Medical History  Diagnosis Date  . Hyperlipidemia   . DM (diabetes mellitus), type 1   . Frequent headaches     Current Outpatient Prescriptions  Medication Sig Dispense Refill  . Insulin Aspart (NOVOLOG FLEXPEN [place]) Inject into the skin. Patient is on a scale using 1-12 units with every meal      . Insulin Glargine (LANTUS SOLOSTAR) 100 UNIT/ML SOPN Inject 12 units every morning and 13 units every evening.  15 pen  0   No current facility-administered medications for this visit.    No Known Allergies  Family History  Problem Relation Age of Onset  . Hyperlipidemia Mother   . Cancer Father   . Arthritis Maternal Grandmother   . Hyperlipidemia Maternal Grandmother   . Hypertension Maternal Grandmother   . Stroke Maternal Grandfather   . Hypertension Maternal Grandfather   . Stroke Paternal Grandmother   . Diabetes Paternal Grandmother     History   Social History  . Marital Status: Married    Spouse Name: N/A    Number of Children: N/A  . Years of Education: N/A   Occupational History  . Not on file.   Social History Main Topics  . Smoking status: Never Smoker   . Smokeless tobacco: Never Used  . Alcohol Use: No  . Drug Use: No  . Sexual Activity: Not on file   Other Topics Concern  . Not on file   Social History Narrative  . No narrative on file    ROS:  Constitutional: Denies fever, malaise, fatigue, headache or abrupt weight changes.  Respiratory: Denies difficulty breathing, shortness of breath, cough or sputum production.   Cardiovascular: Denies chest pain, chest tightness, palpitations or swelling in the hands or feet.    No other specific complaints in a complete review of systems (except as listed in HPI above).  PE:  BP 102/66  Pulse 90  Temp(Src) 98.4 F (36.9 C) (Oral)  Ht _0  (1.651 m)  Wt 164 lb  (74.39 kg)  BMI 27.29 kg/m2  SpO2 98%  LMP 07/20/2013 Wt Readings from Last 3 Encounters:  08/02/13 164 lb (74.39 kg)    General: Appears her stated age, well developed, well nourished in NAD. Cardiovascular: Normal rate and rhythm. S1,S2 noted.  No murmur, rubs or gallops noted. No JVD or BLE edema. No carotid bruits noted. Pulmonary/Chest: Normal effort and positive vesicular breath sounds. No respiratory distress. No wheezes, rales or ronchi noted.   BMET    Component Value Date/Time   NA 140 03/21/2013   NA 134* 10/18/2007 1053   K 4.7 03/21/2013   CL 102 10/18/2007 1053   CO2 23 10/18/2007 1053   GLUCOSE 102* 10/18/2007 1053   BUN 14 03/21/2013   BUN 11 10/18/2007 1053   CREATININE 0.7 03/21/2013   CREATININE 0.61 10/18/2007 1053   CALCIUM 8.4 10/18/2007 1053   GFRNONAA >60 10/18/2007 1053   GFRAA  Value: >60        The eGFR has been calculated using the MDRD equation. This calculation has not been validated in all clinical 10/18/2007 1053    Lipid Panel     Component Value Date/Time   CHOL 195 03/21/2013   TRIG 41 03/21/2013    CBC    Component Value Date/Time   WBC 10.3 10/20/2007 0600   RBC 3.37*  10/20/2007 0600   HGB 10.1* 10/20/2007 0600   HCT 28.9* 10/20/2007 0600   PLT 195 10/20/2007 0600   MCV 85.8 10/20/2007 0600   MCHC 34.8 10/20/2007 0600   RDW 13.3 10/20/2007 0600    Hgb A1C No results found for this basename: HGBA1C     Assessment and Plan:

## 2013-08-02 NOTE — Telephone Encounter (Signed)
Left a message for pt to return call regarding getting a new pt appt with Dr. Elvera LennoxGherghe

## 2013-08-05 ENCOUNTER — Telehealth: Payer: Self-pay

## 2013-08-05 NOTE — Telephone Encounter (Signed)
Relevant patient education assigned to patient using Emmi. ° °

## 2013-08-08 ENCOUNTER — Ambulatory Visit (INDEPENDENT_AMBULATORY_CARE_PROVIDER_SITE_OTHER): Payer: 59 | Admitting: Internal Medicine

## 2013-08-08 ENCOUNTER — Encounter: Payer: Self-pay | Admitting: Internal Medicine

## 2013-08-08 VITALS — BP 116/70 | HR 88 | Temp 98.2°F | Resp 12 | Ht 65.0 in | Wt 167.0 lb

## 2013-08-08 DIAGNOSIS — E109 Type 1 diabetes mellitus without complications: Secondary | ICD-10-CM

## 2013-08-08 MED ORDER — GLUCAGON (RDNA) 1 MG IJ KIT: 1.0000 mg | PACK | Freq: Once | INTRAMUSCULAR | Status: DC | PRN

## 2013-08-08 MED ORDER — INSULIN LISPRO 100 UNIT/ML (KWIKPEN): PEN_INJECTOR | SUBCUTANEOUS | Status: DC

## 2013-08-08 MED ORDER — INSULIN GLARGINE 100 UNIT/ML SOLOSTAR PEN: 12.0000 [IU] | PEN_INJECTOR | Freq: Two times a day (BID) | SUBCUTANEOUS | Status: DC

## 2013-08-08 NOTE — Patient Instructions (Signed)
Stay on the same insulin regimen for now, except switch from Lantus to Levemir and from NovoLog to Humalog.  Please come back in 4 weeks with your sugar log. Please check sugars 3-4x a day, rotating check times.   Basic Rules for Patients with Type I Diabetes Mellitus  1. The American Diabetes Association (ADA) recommended targets: - fasting sugar <130 - after meal sugar <180 - HbA1C <7%  2. Engage in ?150 min moderate exercise per week  3. Make sure you have ?8h of sleep every night as this helps both blood sugars and your weight.  4. Always keep a sugar log (not only record in your meter) and bring it to all appointments with us.   5. "15-15 rule" for hypoglycemia: if sugars are low, take 15 g of carbs** ("fast sugar" - e.g. 4 glucose tablets, 4 oz orange juice), wait 15 min, then check sugars again. If still <80, repeat. Continue  until your sugars >80, then eat a normal meal.   6. Teach family members and coworkers to inject glucagon. Have a glucagon set at home and one at work. They should call 911 after using the set.  7. Check sugar before driving. If <100, correct, and only start driving if sugars rise ?161100. Check sugar every hour when on a long drive.  8. Check sugar before exercising. If <100, correct, and only start exercising if sugars rise ?100. Check sugar every hour when on a long exercise routine and 1h after you finished exercising.   If >250, check urine for ketones. If you have moderate-large ketones in urine, do not start exercise. Hydrate yourself with clear liquids and correct the high sugar. Recheck sugars and ketones before attempting to exercise.  Be aware that you might need less insulin when exercising.  *intense, short, exercise bursts can increase your sugars, but  *less intense, longer (>1h), exercise routines can decrease your sugars.  If you are on a pump, you might need to decrease your basal rate by 10% or more (or even disconnect your pump) while you  exercise to prevent low sugars. Do not disconnect your pump by more than 3 hours at a time! You also might need to decrease your insulin bolus for the meal prior to your exercise time by 20% or more.  9. Make sure you have a MedAlert bracelet or pendant mentioning "Type I Diabetes Mellitus". If you have a prior episode of severe hypoglycemia or hypoglycemia unawareness, it should also mention this.  10. Please do not walk barefoot. Inspect your feet for sores/cuts and let us know if you have them.  11. Please call [facility] with any questions and concerns [phone]).   **E.g. of "fast carbs":   first choice (15 g):  1 tube glucose gel, GlucoPouch 15, 2 oz glucose liquid   second choice (15-16 g):  3 or 4 glucose tablets (best taken  with water), 15 Dextrose Bits chewable   third choice (15-20 g):   cup fruit juice,  cup regular soda, 1 cup skim milk,  1 cup sports drink   fourth choice (15-20 g):  1 small tube [place] gel (not frosting), 2 tbsp raisins, 1 tbsp table sugar,  candy, jelly beans, gum drops - check package for carb amount   (adapted from: Juluis RainierMcCall A.L. "Insulin therapy and hypoglycemia" Endocrinol Metab Clin N Am 2012, 41: 57-87)

## 2013-08-08 NOTE — Progress Notes (Signed)
Patient ID: Ellen [name], female   DOB: 20-Nov-1975, 38 y.o.   MRN: [id number]  HPI: Ellen [name] is a 38 y.o.-year-old female, referred by her PCP, Webb Silversmith, NP, for management of DM1, uncontrolled, without complications. She was seeing Dr Chalmers Cater and Dr Dwyane Dee.   Patient has been diagnosed with diabetes in 2007 - discovered in week 27 of her third pregnancy; she started on insulin at dx. No h/o DKA or hypoglycemia.  Last hemoglobin A1c was: 03/2013: 9.0% Usually: 7-9%  Pt was on an insulin pump: Medtronic - it was too expensive  - stopped 2010-2011. She is not interested to go back on a pump for now.   She is on: - Lantus 12 units 2x a day - NovoLog  - based on ICR: 15 at b'fast 10-12 at lunch 12 at dinner - NovoLog ISF 50, target 150 She needs to change to Levemir and Humalog.  Pt checks her sugars 0-1 a day and they are: - am: 110, 160, 203 in last 3 days, not checking before - 2h after b'fast:  n/c - before lunch: n/c - 2h after lunch: n/c - before dinner: n/c - 2h after dinner: n/c - bedtime: n/c ? lows. Lowest sugar was 52 - in last 3 mo; she has hypoglycemia awareness at 70. Does not have a glucagon kit at home. Highest sugar was 320.   Pt's meals are: - Breakfast: special K protein bar >> 2-3 units - Lunch: meat + vegetables >> 7-8 units; pizza >> 5 units - Dinner: meat + vegetables/fast food >> 8-10 units;  or cooked meal 5-7 units - Snacks: 1-2; sometimes she boluses for these, too   - no CKD, last BUN/creatinine:  Lab Results  Component Value Date   BUN 14 03/21/2013   CREATININE 0.7 03/21/2013   - last set of lipids: Lab Results  Component Value Date   CHOL 195 03/21/2013   TRIG 41 03/21/2013   - last eye exam was in 12/2012. No DR.  - no numbness and tingling in her feet.  Pt has FH of DM in PGM (DM2).   ROS: Constitutional: no weight gain/loss, no fatigue, no subjective hyperthermia/hypothermia, + poor sleep Eyes: no blurry vision, no  xerophthalmia ENT: no sore throat, no nodules palpated in throat, no dysphagia/odynophagia, no hoarseness, + tinnitus Cardiovascular: no CP/SOB/palpitations/leg swelling Respiratory: no cough/SOB Gastrointestinal: no N/V/D/C Musculoskeletal: no muscle/joint aches Skin: no rashes Neurological: no tremors/numbness/tingling/dizziness Psychiatric: no depression/anxiety  Past Medical History  Diagnosis Date  . Hyperlipidemia   . DM (diabetes mellitus), type 1   . Frequent headaches    Past Surgical History  Procedure Laterality Date  . Cesarean section  7/04,9/07.4/09   History   Social History  . Marital Status: Married    Spouse Name: N/A    Number of Children: 4   Occupational History  . Daycare director   Social History Main Topics  . Smoking status: Never Smoker   . Smokeless tobacco: Never Used  . Alcohol Use: No  . Drug Use: No  . Sexual Activity: Yes   Current Outpatient Prescriptions on File Prior to Visit  Medication Sig Dispense Refill  . Insulin Aspart (NOVOLOG FLEXPEN [place]) Inject into the skin. Patient is on a scale using 1-12 units with every meal      . Insulin Glargine (LANTUS SOLOSTAR) 100 UNIT/ML SOPN Inject 12 units every morning and 13 units every evening.  15 pen  0   No current facility-administered medications  on file prior to visit.   No Known Allergies Family History  Problem Relation Age of Onset  . Hyperlipidemia Mother   . Cancer Father     prostate  . Arthritis Maternal Grandmother   . Hyperlipidemia Maternal Grandmother   . Hypertension Maternal Grandmother   . Stroke Maternal Grandfather   . Hypertension Maternal Grandfather   . Stroke Paternal Grandmother   . Diabetes Paternal Grandmother    PE: BP 116/70  Pulse 88  Temp(Src) 98.2 F (36.8 C) (Oral)  Resp 12  Ht _0  (1.651 m)  Wt 167 lb (75.751 kg)  BMI 27.79 kg/m2  SpO2 98%  LMP 07/20/2013 Wt Readings from Last 3 Encounters:  08/08/13 167 lb (75.751 kg)  08/02/13 164  lb (74.39 kg)   Constitutional: overweight, in NAD Eyes: PERRLA, EOMI, no exophthalmos ENT: moist mucous membranes, no thyromegaly, no cervical lymphadenopathy Cardiovascular: RRR, No MRG Respiratory: CTA B Gastrointestinal: abdomen soft, NT, ND, BS+ Musculoskeletal: no deformities, strength intact in all 4 Skin: moist, warm, no rashes Neurological: no tremor with outstretched hands, DTR normal in all 4  ASSESSMENT: 1. DM1, uncontrolled, without complications  PLAN:  1. Patient with long-standing, uncontrolled DM1, on insulin therapy. She is not checking sugars usually >> difficult to assess control and patterns. - We discussed about changes to her insulin regimen, as follows:  Patient Instructions  Stay on the same insulin regimen for now, except switch from Lantus to Levemir and from NovoLog to Humalog.  Please come back in 4 weeks with your sugar log. Please check sugars 3-4x a day, rotating check times.  - Strongly advised her to start checking sugars at different times of the day - check at least 4 times a day, rotating checks - given sugar log and advised how to fill it and to bring it at next appt  - given foot care handout and explained the principles  - given instructions for hypoglycemia management "15-15 rule"  - advised for yearly eye exams - sent glucagon kit Rx to pharmacy - advised to get ketone strips - advised to always have Glu tablets with her - advised for a Med-alert bracelet mentioning "type 1 diabetes mellitus". - given instruction Re: exercising and driving in DM1 (pt instructions) - no signs of other autoimmune disorders - we will check a TSH and HbA1c at next visit - Return to clinic in 1 mo with sugar log

## 2013-08-12 ENCOUNTER — Other Ambulatory Visit: Payer: Self-pay | Admitting: *Deleted

## 2013-08-12 MED ORDER — INSULIN DETEMIR 100 UNIT/ML FLEXPEN: PEN_INJECTOR | SUBCUTANEOUS | Status: DC

## 2013-08-12 NOTE — Telephone Encounter (Signed)
[facility] called stating that the pt said she was suppose to switch to Levemir. Checked Dr Charlean SanfilippoGherghe's notes from last visit to clarify. Rx sent to pharmacy for Levemir. Done.

## 2013-09-06 ENCOUNTER — Ambulatory Visit (INDEPENDENT_AMBULATORY_CARE_PROVIDER_SITE_OTHER): Payer: 59 | Admitting: Internal Medicine

## 2013-09-06 ENCOUNTER — Encounter: Payer: Self-pay | Admitting: Internal Medicine

## 2013-09-06 VITALS — BP 110/62 | HR 108 | Temp 98.2°F | Resp 12 | Wt 167.0 lb

## 2013-09-06 DIAGNOSIS — E109 Type 1 diabetes mellitus without complications: Secondary | ICD-10-CM

## 2013-09-06 LAB — HEMOGLOBIN A1C: HEMOGLOBIN A1C: 9.2 % — AB (ref 4.6–6.5)

## 2013-09-06 LAB — TSH: TSH: 0.98 u[IU]/mL (ref 0.35–5.50)

## 2013-09-06 NOTE — Progress Notes (Signed)
Patient ID: Ellen [name], female   DOB: [birth year], 38 y.o.   MRN: [id number]  HPI: Ellen [name] is a 38 y.o.-year-old female, returning for f/u for DM1, dx 2007, uncontrolled, without complications. No h/o DKA or hypoglycemia. Last visit 1 mo ago.  Last hemoglobin A1c was: 03/2013: 9.0% Usually: 7-9%  Pt was on an insulin pump: Medtronic - it was too expensive  - stopped 2010-2011. She is not interested to go back on a pump for now.   She is on: - Lantus 12 units 2x a day >> will switch to Levemir 2/2 insurance - NovoLog  - based on ICR: 15 at b'fast 10 at lunch 10 at dinner - NovoLog ISF 50, target 150 She needs to change to Levemir and Humalog.  Pt checks her sugars >4x a day (we reviewed log) and they are better: - am: 110, 160, 203 in last 3 days >> 59-200, most low 100s - 2h after b'fast:  n/c - before lunch: n/c >> 98-200 (most 120-150) - 2h after lunch: n/c >> 58 (140-190) 381 - before dinner: n/c >> 87-197 - 2h after dinner: n/c >> 156, 239, 264 - bedtime: n/c >> 113-288 - nighttime: 40-62 only in the nights that she exercises ? lows. Lowest sugar was 40 - in last 3 mo; she has hypoglycemia awareness at 70. Does not have a glucagon kit at home. Highest sugar was 381 x1   Pt's meals are: - Breakfast: special K protein bar >> 2-3 units - Lunch: meat + vegetables >> 7-8 units; pizza >> 5 units - Dinner: meat + vegetables/fast food >> 8-10 units;  or cooked meal 5-7 units - Snacks: 1-2; sometimes she boluses for these, too   - no CKD, last BUN/creatinine:  Lab Results  Component Value Date   BUN 14 03/21/2013   CREATININE 0.7 03/21/2013   - last set of lipids: Lab Results  Component Value Date   CHOL 195 03/21/2013   TRIG 41 03/21/2013   - last eye exam was in 12/2012. No DR.  - no numbness and tingling in her feet.  I reviewed pt's medications, allergies, PMH, social hx, family hx and no changes required, except as mentioned above.  ROS: Constitutional: +  weight gain, no fatigue, no subjective hyperthermia/hypothermia, + poor sleep Eyes: no blurry vision, no xerophthalmia ENT: no sore throat, no nodules palpated in throat, no dysphagia/odynophagia, no hoarseness, + tinnitus Cardiovascular: no CP/SOB/palpitations/leg swelling Respiratory: no cough/SOB Gastrointestinal: no N/V/D/C Musculoskeletal: no muscle/joint aches Skin: no rashes Neurological: no tremors/numbness/tingling/dizziness, + HA  [name]: BP 110/62  Pulse 108  Temp(Src) 98.2 F (36.8 C) (Oral)  Resp 12  Wt 167 lb (75.751 kg)  SpO2 98% Wt Readings from Last 3 Encounters:  09/06/13 167 lb (75.751 kg)  08/08/13 167 lb (75.751 kg)  08/02/13 164 lb (74.39 kg)   Constitutional: overweight, in NAD Eyes: PERRLA, EOMI, no exophthalmos ENT: moist mucous membranes, no thyromegaly, no cervical lymphadenopathy Cardiovascular: RRR, No MRG Respiratory: CTA B Gastrointestinal: abdomen soft, NT, ND, BS+ Musculoskeletal: no deformities, strength intact in all 4 Skin: moist, warm, no rashes  ASSESSMENT: 1. DM1, uncontrolled, without complications  PLAN:  1. Patient with long-standing, uncontrolled DM1, on insulin therapy, with recent improved control after starting to write sugars down. She has severe lows at night after she exercises before dinner (20-30 min of bicycle, etc.).  - We discussed about changes to her insulin regimen, as follows:  Patient Instructions  Please make the following  changes in your insulin regimen in the days that you exercise: - Lantus 12 units in am and 10 units at night - NovoLog - ICR: 15 at b'fast 10 at lunch 12 at dinner - Continue NovoLog ISF 50, target 150  If you do not exercise, stay on the same settings.  Please return in 1 month with your sugar log.  - continue checking sugars at different times of the day - check at least 4 times a day, rotating checks - does a great job with this - up to date with yearly eye exams - no signs of other  autoimmune disorders - but check TSH today - check HbA1c and ACR today - Return to clinic in 1 mo with sugar log   Orders Placed This Encounter  Procedures  . HgB A1c  . TSH  . Microalbumin / creatinine urine ratio   Office Visit on 09/06/2013  Component Date Value Ref Range Status  . Hemoglobin A1C 09/06/2013 9.2* 4.6 - 6.5 % Final   Glycemic Control Guidelines for People with Diabetes:Non Diabetic:  <6%Goal of Therapy: <7%Additional Action Suggested:  >8%   . TSH 09/06/2013 0.98  0.35 - 5.50 uIU/mL Final  . Microalb, Ur 09/06/2013 0.2  0.0 - 1.9 mg/dL Final  . Creatinine,U 09/06/2013 52.8   Final  . Microalb Creat Ratio 09/06/2013 0.4  0.0 - 30.0 mg/g Final

## 2013-09-06 NOTE — Patient Instructions (Signed)
Please make the following changes in your insulin regimen in the days that you exercise: - Lantus 12 units in am and 10 units at night - NovoLog - ICR: 15 at b'fast 10 at lunch 12 at dinner - Continue NovoLog ISF 50, target 150  If you do not exercise, stay on the same settings.  Please return in 1 month with your sugar log.

## 2013-09-09 ENCOUNTER — Encounter: Payer: Self-pay | Admitting: *Deleted

## 2013-09-09 LAB — MICROALBUMIN / CREATININE URINE RATIO
Creatinine,U: 52.8 mg/dL
Microalb Creat Ratio: 0.4 mg/g (ref 0.0–30.0)
Microalb, Ur: 0.2 mg/dL (ref 0.0–1.9)

## 2013-10-08 ENCOUNTER — Ambulatory Visit (INDEPENDENT_AMBULATORY_CARE_PROVIDER_SITE_OTHER): Payer: 59 | Admitting: Internal Medicine

## 2013-10-08 ENCOUNTER — Encounter: Payer: Self-pay | Admitting: Internal Medicine

## 2013-10-08 VITALS — BP 102/68 | HR 88 | Temp 97.5°F | Resp 12 | Wt 165.0 lb

## 2013-10-08 DIAGNOSIS — E109 Type 1 diabetes mellitus without complications: Secondary | ICD-10-CM

## 2013-10-08 NOTE — Progress Notes (Signed)
Patient ID: Ellen [name], female   DOB: 01-May-1976, 38 y.o.   MRN: [id number]  HPI: Ellen [name] is a 38 y.o.-year-old female, returning for f/u for DM1, dx 2007, uncontrolled, without complications. No h/o DKA or hypoglycemia. Last visit 1 mo ago.  Last hemoglobin A1c was: Lab Results  Component Value Date   HGBA1C 9.2* 09/06/2013   03/2013: 9.0% Usually: 7-9%  Pt was on an insulin pump: Medtronic - it was too expensive  - stopped 2010-2011. She is not interested to go back on a pump for now.   She is on: - Levemir 12 units 2x a day - NovoLog  - based on ICR: 15 at b'fast 10 at lunch 10 at dinner - NovoLog ISF 50, target 150 She needs to change to Levemir and Humalog.  Pt checks her sugars >4x a day (we reviewed log) and they are fluctuating: - am: 110, 160, 203 in last 3 days >> 59-200, most low 100s >> 45, 52 73-184 (most low 100s) - 2h after b'fast:  N/c >> 154, 255 - before lunch: n/c >> 98-200 (most 120-150) >> 62-209 (most 100s) - 2h after lunch: n/c >> 58 (140-190) 381 >> (51) 100-178 (381) - before dinner: n/c >> 87-197 >> 52-263 (88-133) - 2h after dinner: n/c >> 156, 239, 264 >> 46, 65, 232, 235 - bedtime: n/c >> 113-288 >>  - nighttime: 40-62 only in the nights that she exercises >> no more lows now ? lows. Lowest sugar was 40 - in last 3 mo; she has hypoglycemia awareness at 70. Does not have a glucagon kit at home. Highest sugar was 381 x1   Pt's meals are: - Breakfast: special K protein bar >> 2-3 units - Lunch: meat + vegetables >> 7-8 units; pizza >> 5 units - Dinner: meat + vegetables/fast food >> 8-10 units;  or cooked meal 5-7 units - Snacks: 1-2; sometimes she boluses for these, too   - no CKD, last BUN/creatinine:  Lab Results  Component Value Date   BUN 14 03/21/2013   CREATININE 0.7 03/21/2013   - last set of lipids: Lab Results  Component Value Date   CHOL 195 03/21/2013   TRIG 41 03/21/2013   - last eye exam was in 12/2012. No DR.  - no  numbness and tingling in her feet.  Last TSH: Lab Results  Component Value Date   TSH 0.98 09/06/2013   I reviewed pt's medications, allergies, PMH, social hx, family hx and no changes required, except as mentioned above.  ROS: Constitutional: no weight gain, no fatigue, no subjective hyperthermia/hypothermia Eyes: no blurry vision, no xerophthalmia ENT: no sore throat, no nodules palpated in throat, no dysphagia/odynophagia, no hoarseness Cardiovascular: no CP/SOB/palpitations/leg swelling Respiratory: no cough/SOB Gastrointestinal: no N/V/D/C Musculoskeletal: no muscle/joint aches Skin: no rashes Neurological: no tremors/numbness/tingling/dizziness  PE: BP 102/68  Pulse 88  Temp(Src) 97.5 F (36.4 C) (Oral)  Resp 12  Wt 165 lb (74.844 kg)  SpO2 95% Body mass index is 27.46 kg/(m^2).  Wt Readings from Last 3 Encounters:  10/08/13 165 lb (74.844 kg)  09/06/13 167 lb (75.751 kg)  08/08/13 167 lb (75.751 kg)   Constitutional: overweight, in NAD Eyes: PERRLA, EOMI, no exophthalmos ENT: moist mucous membranes, no thyromegaly, no cervical lymphadenopathy Cardiovascular: RRR, No MRG Respiratory: CTA B Gastrointestinal: abdomen soft, NT, ND, BS+ Musculoskeletal: no deformities, strength intact in all 4 Skin: moist, warm, no rashes  ASSESSMENT: 1. DM1, uncontrolled, without complications  PLAN:  1.  Patient with long-standing, uncontrolled DM1, on insulin therapy, with recent improved control after starting to write sugars down. No more severe lows at night (was having these after she exercised before dinner (20-30 min of bicycle, etc.)), but she did not exercise a lot lately. She still has fluctuating sugars and most would be helped by a better carb counting, I think, and she agrees. I will therefore refer her to nutrition for this. She is having lows if she sleeps in on Saturday >> advised her to reduce Lantus a little the night before. - We will not change her insulin  regimen:    Patient Instructions  Stay on the same insulin regimen, but on Fridays inject only 10 units of Levemir at bedtime.   - continue checking sugars at different times of the day - check at least 4 times a day, rotating checks - does a great job with this - up to date with yearly eye exams - no signs of other autoimmune disorders - last TSH normal, at 0.98 on 09/06/2013. - check HbA1c at next visit - brings a large packet of forms to be filled out for DMV... I will let her know when they are ready to be picked up. - Return to clinic in 2 mo with sugar log

## 2013-10-08 NOTE — Patient Instructions (Addendum)
Stay on the same insulin regimen, but on Fridays inject only 10 units of Levemir at bedtime.

## 2013-10-13 DIAGNOSIS — Z0279 Encounter for issue of other medical certificate: Secondary | ICD-10-CM

## 2013-11-07 ENCOUNTER — Encounter: Payer: 59 | Attending: Internal Medicine | Admitting: *Deleted

## 2013-11-07 VITALS — Ht 65.0 in | Wt 165.6 lb

## 2013-11-07 DIAGNOSIS — Z713 Dietary counseling and surveillance: Secondary | ICD-10-CM | POA: Insufficient documentation

## 2013-11-07 DIAGNOSIS — Z794 Long term (current) use of insulin: Secondary | ICD-10-CM | POA: Insufficient documentation

## 2013-11-07 DIAGNOSIS — E109 Type 1 diabetes mellitus without complications: Secondary | ICD-10-CM

## 2013-11-07 NOTE — Patient Instructions (Signed)
Plan:  Your Fit Diet Plan appears appropriate Aim for 2 Carb Choices per meal (30 grams) +/- 1 either way  Aim for 0-1 Carbs per snack if hungry  Include protein in moderation with your meals and snacks Continue reading food labels for Total Carbohydrate and Fat Grams of foods Consider Calorie [facility] [employer] APP for your phone Continue with your activity level as tolerated

## 2013-11-07 NOTE — Progress Notes (Signed)
Appt start time: 1030 end time:  1200.  Assessment:  Patient was seen on  11/07/13 for individual diabetes education. Patient has had DM 1 for about 7 years. She has 4 children ages 296 - 36 years old. She is currently following a Risk managerit Plan Diet that uses different colored food containers to designate different food groups and the number of servings of each group is indicated for weight loss. She is not eating high fat foods right now, but when she has in the past she acknowledges that her BGs go too high for several hours after that meal. She also states that her BGs have to be fairly high before she can correct because she can only give insulin in 1 unit increments.   Current HbA1c: 9.2%  Preferred Learning Style:   No preference indicated   Learning Readiness:   Ready  Change in progress  MEDICATIONS: see list. Diabetes medications are Levemir and Humalog  DIETARY INTAKE:  24-hr recall:  B ( AM): toast with PNB and fresh fruit OR 2 eggs, [place] bacon, Smoothie  Snk ( AM): no  L ( PM): meat and vegetables left over or maybe pizza  Snk ( PM): no D ( PM): hot meal with meat, starch, vegetables OR fast food due to children's active sports schedules Snk ( PM): occasional to prevent low BG during the night or if dinner was not too late Beverages:   Usual physical activity: likes riding bike  Estimated energy needs: 1200 calories 135 g carbohydrates 90 g protein 33 g fat  Progress Towards Goal(s):  In progress.   Nutritional Diagnosis:  NB-1.1 Food and nutrition-related knowledge deficit As related to carb counting.  As evidenced by A1c of 9.2%.    Intervention:  Nutrition counseling provided.  Discussed diabetes disease process and treatment options.  Discussed physiology of Type 1 and Type 2 diabetes.  Encouraged moderate weight reduction to improve glucose levels.  Discussed role of medications and diet in glucose control  Provided education on macronutrients on glucose  levels.  Provided education on carb counting, importance of regularly scheduled meals/snacks, and meal planning. Showed her how to incorporate her Fit Plan into Carb Counting for better BG control.  Discussed effects of physical activity on glucose levels and long-term glucose control.  Recommended increase of physical activity/week.  Reviewed patient medications.  Discussed role of medication on blood glucose and possible side effects.  Suggested she ask about use of a Pediatric insulin pen that would allow 0.5 unit increments.  Discussed blood glucose monitoring and interpretation.  Discussed recommended target ranges and individual ranges.    Described short-term complications: hyper- and hypo-glycemia.  Discussed causes,symptoms, and treatment options.  Discussed prevention, detection, and treatment of long-term complications.  Discussed the role of prolonged elevated glucose levels on body systems.  Discussed role of stress on blood glucose levels and discussed strategies to manage psychosocial issues.  Discussed recommendations for long-term diabetes self-care.  Provided checklist for medical, dental, and emotional self-care.  Plan:  Your Fit Diet Plan appears appropriate Aim for 2 Carb Choices per meal (30 grams) +/- 1 either way  Aim for 0-1 Carbs per snack if hungry  Include protein in moderation with your meals and snacks Continue reading food labels for Total Carbohydrate and Fat Grams of foods Consider Calorie [employer] APP for your phone Continue with your activity level as tolerated Teaching Method Utilized: Visual, Auditory and Hands on  Handouts given during visit include: Living Well with Diabetes Carb  Counting and Food Label handouts Meal Plan Card  Barriers to learning/adherence to lifestyle change: none  Diabetes self-care support plan:   [facility] DM [facility] support group   Demonstrated degree of understanding via:  Teach Back   Monitoring/Evaluation:  Dietary intake,  exercise, SMBG, and body weight prn.

## 2013-11-14 ENCOUNTER — Encounter: Payer: Self-pay | Admitting: *Deleted

## 2013-12-09 ENCOUNTER — Ambulatory Visit (INDEPENDENT_AMBULATORY_CARE_PROVIDER_SITE_OTHER): Payer: 59 | Admitting: Internal Medicine

## 2013-12-09 VITALS — Ht 66.0 in | Wt 166.6 lb

## 2013-12-09 DIAGNOSIS — E109 Type 1 diabetes mellitus without complications: Secondary | ICD-10-CM

## 2013-12-09 NOTE — Progress Notes (Signed)
Pt brings DMV form to fill out. She cancelled the appt. but she comes and bring this form. We discussed about why the form was rejected by DMV. I filled out all the info and gave [facility] back to [facility]

## 2014-01-09 ENCOUNTER — Ambulatory Visit (INDEPENDENT_AMBULATORY_CARE_PROVIDER_SITE_OTHER): Payer: 59 | Admitting: Internal Medicine

## 2014-01-09 ENCOUNTER — Encounter: Payer: Self-pay | Admitting: Internal Medicine

## 2014-01-09 VITALS — BP 104/66 | HR 83 | Temp 98.0°F | Wt 166.0 lb

## 2014-01-09 DIAGNOSIS — J069 Acute upper respiratory infection, unspecified: Secondary | ICD-10-CM

## 2014-01-09 MED ORDER — AZITHROMYCIN 250 MG PO TABS: ORAL_TABLET | ORAL | Status: DC

## 2014-01-09 NOTE — Progress Notes (Signed)
Pre visit review using our clinic review tool, if applicable. No additional management support is needed unless otherwise documented below in the visit note. 

## 2014-01-09 NOTE — Progress Notes (Signed)
HPI  Pt presents to the clinic today with c/o headache, nasal congestion, post nasal drip and sore throat. She reports this started about 1 week ago. She has had some associated fatigue and dry cough as well. She denies fever, chills or body aches. She has tried Ibuprofen OTC. She has no history of seasonal allergies or breathing problems. She has had sick contacts.  Review of Systems    Past Medical History  Diagnosis Date  . Hyperlipidemia   . DM (diabetes mellitus), type 1   . Frequent headaches     Family History  Problem Relation Age of Onset  . Hyperlipidemia Mother   . Cancer Father     prostate  . Arthritis Maternal Grandmother   . Hyperlipidemia Maternal Grandmother   . Hypertension Maternal Grandmother   . Stroke Maternal Grandfather   . Hypertension Maternal Grandfather   . Stroke Paternal Grandmother   . Diabetes Paternal Grandmother     History   Social History  . Marital Status: Married    Spouse Name: N/A    Number of Children: N/A  . Years of Education: N/A   Occupational History  . Not on file.   Social History Main Topics  . Smoking status: Never Smoker   . Smokeless tobacco: Never Used  . Alcohol Use: No  . Drug Use: No  . Sexual Activity: Yes   Other Topics Concern  . Not on file   Social History Narrative  . No narrative on file    No Known Allergies   Constitutional: Positive headache, fatigue. Denies fever or abrupt weight changes.  HEENT:  Positive nasal congestion and sore throat. Denies eye redness, ear pain, ringing in the ears, wax buildup, runny nose or bloody nose. Respiratory: Positive cough. Denies difficulty breathing or shortness of breath.  Cardiovascular: Denies chest pain, chest tightness, palpitations or swelling in the hands or feet.   No other specific complaints in a complete review of systems (except as listed in HPI above).  Objective:  BP 104/66  Pulse 83  Temp(Src) 98 F (36.7 C) (Oral)  Wt 166 lb (75.297  kg)  SpO2 99%   General: Appears her stated age, ill appearing, in NAD. HEENT: Head: normal shape and size, no sinus tenderness noted; Eyes: sclera white, no icterus, conjunctiva pink, PERRLA and EOMs intact; Ears: Tm's gray and intact, normal light reflex, + effusions; Nose: mucosa pink and moist, septum midline; Throat/Mouth: + PND. Teeth present, mucosa erythematous and moist, no exudate noted, no lesions or ulcerations noted.  Neck: Bilateral tonsillar lymphadenopathy. Neck supple, trachea midline. No massses, lumps or thyromegaly present.  Cardiovascular: Normal rate and rhythm. S1,S2 noted.  No murmur, rubs or gallops noted. No JVD or BLE edema. No carotid bruits noted. Pulmonary/Chest: Normal effort and positive vesicular breath sounds. No respiratory distress. No wheezes, rales or ronchi noted.      Assessment & Plan:   URI:  Ok to do salt water gargles for sore throat Ibuprofen for swelling RX given for zpack x 5 days Delsym OTC for cough  RTC as needed or if symptoms persist.

## 2014-01-09 NOTE — Patient Instructions (Addendum)
Upper Respiratory Infection, Adult An upper respiratory infection (URI) is also sometimes known as the common cold. The upper respiratory tract includes the nose, sinuses, throat, trachea, and bronchi. Bronchi are the airways leading to the lungs. Most people improve within 1 week, but symptoms can last up to 2 weeks. A residual cough may last even longer.  CAUSES Many different viruses can infect the tissues lining the upper respiratory tract. The tissues become irritated and inflamed and often become very moist. Mucus production is also common. A cold is contagious. You can easily spread the virus to others by oral contact. This includes kissing, sharing a glass, coughing, or sneezing. Touching your mouth or nose and then touching a surface, which is then touched by another person, can also spread the virus. SYMPTOMS  Symptoms typically develop 1 to 3 days after you come in contact with a cold virus. Symptoms vary from person to person. They may include:  Runny nose.  Sneezing.  Nasal congestion.  Sinus irritation.  Sore throat.  Loss of voice (laryngitis).  Cough.  Fatigue.  Muscle aches.  Loss of appetite.  Headache.  Low-grade fever. DIAGNOSIS  You might diagnose your own cold based on familiar symptoms, since most people get a cold 2 to 3 times a year. Your caregiver can confirm this based on your exam. Most importantly, your caregiver can check that your symptoms are not due to another disease such as strep throat, sinusitis, pneumonia, asthma, or epiglottitis. Blood tests, throat tests, and X-rays are not necessary to diagnose a common cold, but they may sometimes be helpful in excluding other more serious diseases. Your caregiver will decide if any further tests are required. RISKS AND COMPLICATIONS  You may be at risk for a more severe case of the common cold if you smoke cigarettes, have chronic heart disease (such as heart failure) or lung disease (such as asthma), or if  you have a weakened immune system. The very young and very old are also at risk for more serious infections. Bacterial sinusitis, middle ear infections, and bacterial pneumonia can complicate the common cold. The common cold can worsen asthma and chronic obstructive pulmonary disease (COPD). Sometimes, these complications can require emergency medical care and may be life-threatening. PREVENTION  The best way to protect against getting a cold is to practice good hygiene. Avoid oral or hand contact with people with cold symptoms. Wash your hands often if contact occurs. There is no clear evidence that vitamin C, vitamin E, echinacea, or exercise reduces the chance of developing a cold. However, it is always recommended to get plenty of rest and practice good nutrition. TREATMENT  Treatment is directed at relieving symptoms. There is no cure. Antibiotics are not effective, because the infection is caused by a virus, not by bacteria. Treatment may include:  Increased fluid intake. Sports drinks offer valuable electrolytes, sugars, and fluids.  Breathing heated mist or steam (vaporizer or shower).  Eating chicken soup or other clear broths, and maintaining good nutrition.  Getting plenty of rest.  Using gargles or lozenges for comfort.  Controlling fevers with ibuprofen or acetaminophen as directed by your caregiver.  Increasing usage of your inhaler if you have asthma. Zinc gel and zinc lozenges, taken in the first 24 hours of the common cold, can shorten the duration and lessen the severity of symptoms. Pain medicines may help with fever, muscle aches, and throat pain. A variety of non-prescription medicines are available to treat congestion and runny nose. Your caregiver   can make recommendations and may suggest nasal or lung inhalers for other symptoms.  HOME CARE INSTRUCTIONS   Only take over-the-counter or prescription medicines for pain, discomfort, or fever as directed by your  caregiver.  Use a warm mist humidifier or inhale steam from a shower to increase air moisture. This may keep secretions moist and make it easier to breathe.  Drink enough water and fluids to keep your urine clear or pale yellow.  Rest as needed.  Return to work when your temperature has returned to normal or as your caregiver advises. You may need to stay home longer to avoid infecting others. You can also use a face mask and careful hand washing to prevent spread of the virus. SEEK MEDICAL CARE IF:   After the first few days, you feel you are getting worse rather than better.  You need your caregiver's advice about medicines to control symptoms.  You develop chills, worsening shortness of breath, or brown or red sputum. These may be signs of pneumonia.  You develop yellow or brown nasal discharge or pain in the face, especially when you bend forward. These may be signs of sinusitis.  You develop a fever, swollen neck glands, pain with swallowing, or white areas in the back of your throat. These may be signs of strep throat. SEEK IMMEDIATE MEDICAL CARE IF:   You have a fever.  You develop severe or persistent headache, ear pain, sinus pain, or chest pain.  You develop wheezing, a prolonged cough, cough up blood, or have a change in your usual mucus (if you have chronic lung disease).  You develop sore muscles or a stiff neck. Document Released: 12/07/2000 Document Revised: 09/05/2011 Document Reviewed: 10/15/2010 Exit[employer] Patient Information 2015 [employer]. This information is not intended to replace advice given to you by your health care provider. Make sure you discuss any questions you have with your health care provider.  

## 2014-02-14 ENCOUNTER — Ambulatory Visit (INDEPENDENT_AMBULATORY_CARE_PROVIDER_SITE_OTHER): Payer: 59 | Admitting: Internal Medicine

## 2014-02-14 ENCOUNTER — Encounter: Payer: Self-pay | Admitting: Internal Medicine

## 2014-02-14 VITALS — BP 104/60 | HR 91 | Temp 98.5°F | Wt 164.0 lb

## 2014-02-14 DIAGNOSIS — R3915 Urgency of urination: Secondary | ICD-10-CM

## 2014-02-14 DIAGNOSIS — R81 Glycosuria: Secondary | ICD-10-CM

## 2014-02-14 DIAGNOSIS — R350 Frequency of micturition: Secondary | ICD-10-CM

## 2014-02-14 LAB — POCT URINALYSIS DIPSTICK
BILIRUBIN UA: NEGATIVE
Leukocytes, UA: NEGATIVE
Nitrite, UA: NEGATIVE
Protein, UA: NEGATIVE
RBC UA: NEGATIVE
SPEC GRAV UA: 1.015
Urobilinogen, UA: NEGATIVE
pH, UA: 6.5

## 2014-02-14 NOTE — Progress Notes (Signed)
Pre visit review using our clinic review tool, if applicable. No additional management support is needed unless otherwise documented below in the visit note. 

## 2014-02-14 NOTE — Addendum Note (Signed)
Addended by: Roena MaladyEVONTENNO, MELANIE Y on: 02/14/2014 02:29 PM   Modules accepted: Orders

## 2014-02-14 NOTE — Patient Instructions (Addendum)

## 2014-02-14 NOTE — Progress Notes (Signed)
HPI  Pt presents to the clinic today with c/o urgency and frequency. She reports this started yesterday. She denies dysuria, fever, chills or flank pain. She has had EColi in her urine in the past and just wants to make sure. She does have a history of DM1.    Review of Systems  Past Medical History  Diagnosis Date  . Hyperlipidemia   . DM (diabetes mellitus), type 1   . Frequent headaches     Family History  Problem Relation Age of Onset  . Hyperlipidemia Mother   . Cancer Father     prostate  . Arthritis Maternal Grandmother   . Hyperlipidemia Maternal Grandmother   . Hypertension Maternal Grandmother   . Stroke Maternal Grandfather   . Hypertension Maternal Grandfather   . Stroke Paternal Grandmother   . Diabetes Paternal Grandmother     History   Social History  . Marital Status: Married    Spouse Name: N/A    Number of Children: N/A  . Years of Education: N/A   Occupational History  . Not on file.   Social History Main Topics  . Smoking status: Never Smoker   . Smokeless tobacco: Never Used  . Alcohol Use: No  . Drug Use: No  . Sexual Activity: Yes   Other Topics Concern  . Not on file   Social History Narrative  . No narrative on file    No Known Allergies  Constitutional: Denies fever, malaise, fatigue, headache or abrupt weight changes.   GU: Pt reports urgency, frequency. Denies burning sensation, blood in urine, odor or discharge. Skin: Denies redness, rashes, lesions or ulcercations.   No other specific complaints in a complete review of systems (except as listed in HPI above).    Objective:   Physical Exam BP 104/60  Pulse 91  Temp(Src) 98.5 F (36.9 C) (Oral)  Wt 164 lb (74.39 kg)  SpO2 99%  Wt Readings from Last 3 Encounters:  01/09/14 166 lb (75.297 kg)  12/09/13 166 lb 9.6 oz (75.569 kg)  11/14/13 165 lb 9.6 oz (75.116 kg)    General: Appears her stated age, well developed, well nourished in NAD. Cardiovascular: Normal rate  and rhythm. S1,S2 noted.  No murmur, rubs or gallops noted. No JVD or BLE edema. No carotid bruits noted. Pulmonary/Chest: Normal effort and positive vesicular breath sounds. No respiratory distress. No wheezes, rales or ronchi noted.  Abdomen: Soft and nontender. Normal bowel sounds, no bruits noted. No distention or masses noted. Liver, spleen and kidneys non palpable. No CVA tenderness.      Assessment & Plan:   Urgency, Frequency:  Urinalysis: + glucose, + ketones (she reports that she did miss a dose of insulin today) Try to regulate sugar as best you can Drink plenty of fluids  RTC as needed or if symptoms persist.

## 2014-02-18 ENCOUNTER — Other Ambulatory Visit: Payer: Self-pay | Admitting: Internal Medicine

## 2014-02-18 MED ORDER — CIPROFLOXACIN HCL 500 MG PO TABS: 500.0000 mg | ORAL_TABLET | Freq: Two times a day (BID) | ORAL | Status: DC

## 2014-02-21 ENCOUNTER — Telehealth: Payer: Self-pay

## 2014-02-21 NOTE — Telephone Encounter (Signed)
Diabetic Bundle. LVOM for pt to call back and schedule appointment.

## 2014-05-03 ENCOUNTER — Other Ambulatory Visit: Payer: Self-pay | Admitting: Internal Medicine

## 2014-10-16 ENCOUNTER — Other Ambulatory Visit: Payer: Self-pay | Admitting: Internal Medicine

## 2014-10-17 ENCOUNTER — Ambulatory Visit (INDEPENDENT_AMBULATORY_CARE_PROVIDER_SITE_OTHER): Payer: 59 | Admitting: Internal Medicine

## 2014-10-17 ENCOUNTER — Encounter: Payer: Self-pay | Admitting: Internal Medicine

## 2014-10-17 VITALS — BP 108/66 | HR 86 | Temp 98.0°F | Resp 12 | Wt 164.0 lb

## 2014-10-17 DIAGNOSIS — E109 Type 1 diabetes mellitus without complications: Secondary | ICD-10-CM | POA: Diagnosis not present

## 2014-10-17 LAB — LIPID PANEL
CHOL/HDL RATIO: 2
CHOLESTEROL: 191 mg/dL (ref 0–200)
HDL: 83.4 mg/dL (ref >39.00)
LDL CALC: 98 mg/dL (ref 0–99)
NonHDL: 107.6
TRIGLYCERIDES: 48 mg/dL (ref 0.0–149.0)
VLDL: 9.6 mg/dL (ref 0.0–40.0)

## 2014-10-17 LAB — MICROALBUMIN / CREATININE URINE RATIO
CREATININE, U: 208.4 mg/dL
MICROALB UR: 1.2 mg/dL (ref 0.0–1.9)
MICROALB/CREAT RATIO: 0.6 mg/g (ref 0.0–30.0)

## 2014-10-17 LAB — COMPREHENSIVE METABOLIC PANEL
ALK PHOS: 74 U/L (ref 39–117)
ALT: 26 U/L (ref 0–35)
AST: 23 U/L (ref 0–37)
Albumin: 4.1 g/dL (ref 3.5–5.2)
BUN: 15 mg/dL (ref 6–23)
CO2: 27 mEq/L (ref 19–32)
CREATININE: 0.68 mg/dL (ref 0.40–1.20)
Calcium: 9.2 mg/dL (ref 8.4–10.5)
Chloride: 102 mEq/L (ref 96–112)
GFR: 103.36 mL/min (ref >60.00)
Glucose, Bld: 210 mg/dL — ABNORMAL HIGH (ref 70–99)
POTASSIUM: 4 meq/L (ref 3.5–5.1)
Sodium: 136 mEq/L (ref 135–145)
Total Bilirubin: 0.4 mg/dL (ref 0.2–1.2)
Total Protein: 7.6 g/dL (ref 6.0–8.3)

## 2014-10-17 LAB — HEMOGLOBIN A1C: Hgb A1c MFr Bld: 10 % — ABNORMAL HIGH (ref 4.6–6.5)

## 2014-10-17 LAB — TSH: TSH: 1.51 u[IU]/mL (ref 0.35–4.50)

## 2014-10-17 MED ORDER — INSULIN LISPRO 100 UNIT/ML (KWIKPEN): PEN_INJECTOR | SUBCUTANEOUS | Status: DC

## 2014-10-17 MED ORDER — INSULIN DETEMIR 100 UNIT/ML FLEXPEN: PEN_INJECTOR | SUBCUTANEOUS | Status: DC

## 2014-10-17 MED ORDER — GLUCAGON (RDNA) 1 MG IJ KIT: 1.0000 mg | PACK | Freq: Once | INTRAMUSCULAR | Status: DC | PRN

## 2014-10-17 NOTE — Patient Instructions (Signed)
Please talk to your insurance about: - if they cover a CGM - diabetes education visit for CGM training - test strips and lancets for the AccuChek [employer] Expert.   Please change your insulin doses: - Levemir - 12-14 units in am - 12 units at night (except 10 units on Friday night) - Humalog  - based on ICR: 15 >> 12 at b'fast 10 >> 8 at lunch 10 >> 8 at dinner - Humalog ISF 50, target 150 >> 130

## 2014-10-17 NOTE — Progress Notes (Signed)
Patient ID: Ellen [name], female   DOB: Sep 15, 1975, 39 y.o.   MRN: [id number]  HPI: Ellen [name] is a 39 y.o.-year-old female, returning for f/u for DM1, dx 2007, uncontrolled, without complications. No h/o DKA or hypoglycemia. Last visit 1 year ago! She had pbs with insurance; husband out of work.  Last hemoglobin A1c was: Lab Results  Component Value Date   HGBA1C 9.2* 09/06/2013  03/2013: 9.0% Usually: 7-9%  Pt was on an insulin pump: Medtronic - it was too expensive  - stopped 2010-2011. She is not interested to go back on a pump for now.   She is on: - Levemir: - 12-14 units in am - 12 units in HS (except 10 units hs on Friday >> sleeps in on Sat) - Humalog  - based on ICR: 15 at b'fast 10 at lunch 10 at dinner - Humalog ISF 50, target 150  Pt does not check sugars anymore:  - am: 110, 160, 203 in last 3 days >> 59-200, most low 100s >> 45, 52 73-184 (most low 100s) >> 90-120 - 2h after b'fast:  N/c >> 154, 255 >> n/c - before lunch: n/c >> 98-200 (most 120-150) >> 62-209 (most 100s) >> 140-150 - 2h after lunch: n/c >> 58 (140-190) 381 >> (51) 100-178 (381) >> n/c - before dinner: n/c >> 87-197 >> 52-263 (88-133) >> 180-250 - 2h after dinner: n/c >> 156, 239, 264 >> 46, 65, 232, 235 >> n/c - bedtime: n/c >> 113-288 >> n/c - nighttime: 40-62 only in the nights that she exercises >> no more lows now ? lows. Lowest sugar was 90s - but 1 lower at night >> did not check ; she has hypoglycemia awareness at 70.  Does have a glucagon kit at home. Highest sugar was 300s.  Pt's meals are: - Breakfast: special K protein bar >> 2-3 units - Lunch: meat + vegetables >> 7-8 units; pizza >> 5 units - Dinner: meat + vegetables/fast food >> 8-10 units;  or cooked meal 5-7 units - Snacks: 1-2; sometimes she boluses for these, too   - no CKD, last BUN/creatinine:  Lab Results  Component Value Date   BUN 14 03/21/2013   CREATININE 0.7 03/21/2013   - last set of lipids: Lab Results   Component Value Date   CHOL 195 03/21/2013   TRIG 41 03/21/2013   - last eye exam was in 12/2012. No DR.  - no numbness and tingling in her feet.  Last TSH: Lab Results  Component Value Date   TSH 0.98 09/06/2013   I reviewed pt's medications, allergies, PMH, social hx, family hx, and changes were documented in the history of present illness. Otherwise, unchanged from my initial visit note. ROS: Constitutional: no weight gain, no fatigue, no subjective hyperthermia/hypothermia Eyes: no blurry vision, no xerophthalmia ENT: no sore throat, no nodules palpated in throat, no dysphagia/odynophagia, no hoarseness Cardiovascular: no CP/SOB/palpitations/leg swelling Respiratory: no cough/SOB Gastrointestinal: no N/V/D/C Musculoskeletal: no muscle/joint aches Skin: no rashes Neurological: no tremors/numbness/tingling/dizziness  PE: BP 108/66 mmHg  Pulse 86  Temp(Src) 98 F (36.7 C) (Oral)  Resp 12  Wt 164 lb (74.39 kg)  SpO2 98% Body mass index is 26.48 kg/(m^2).  Wt Readings from Last 3 Encounters:  10/17/14 164 lb (74.39 kg)  02/14/14 164 lb (74.39 kg)  01/09/14 166 lb (75.297 kg)   Constitutional: overweight, in NAD Eyes: PERRLA, EOMI, no exophthalmos ENT: moist mucous membranes, no thyromegaly, no cervical lymphadenopathy Cardiovascular: RRR, No  MRG Respiratory: CTA B Gastrointestinal: abdomen soft, NT, ND, BS+ Musculoskeletal: no deformities, strength intact in all 4 Skin: moist, warm, no rashes  ASSESSMENT: 1. DM1, uncontrolled, without complications  PLAN:  1. Patient with long-standing, uncontrolled DM1, on insulin therapy, with a staircase effect in her blood sugars due to insufficient insulin boluses. No more severe lows. She is using a higher dose of Levemir in the morning if she plans to eat more for breakfast that morning. I advised her not to do so, but rather, to increase her Humalog before breakfast in that case. - At last visit, I referred her for a  refresher in carb counting. She did not feel that this helped her a lot, and, unfortunately, her insurance did not pay for the nutritionist visit and she had to pay $500. - She is interested in a CGM, and this would be a great idea for her. I advised her to discuss with the insurance if they cover it. I also think that she would be a great candidate for an Accu-Chek [employer] expert, since we need more flexibility with her Humalog doses.  - I also need her to come to the appointments more frequent than once a year - I advised her to: Patient Instructions  Please talk to your insurance about: - if they cover a CGM - diabetes education visit for CGM training - test strips and lancets for the AccuChek [employer] Expert.   Please change your insulin doses: - Levemir - 12-14 units in am - I advised her to try to stay with 12 units - 12 units at night (except 10 units on Friday night) - Humalog  - based on ICR: 15 >> 12 at b'fast 10 >> 8 at lunch 10 >> 8 at dinner - Humalog ISF 50, target 150 >> 130  - continue checking sugars at different times of the day - check at least 4 times a day, rotating checks - She needs a new eye exam - no signs of other autoimmune disorders - last TSH normal, at 0.98 on 09/06/2013. We will check another one today - check HbA1c, CMP, lipids, ACR today - I refilled her insulin and glucagon prescriptions - Return to clinic in 1.5 mo with sugar log   - time spent with the patient: 40 min, of which >50% was spent in reviewing her sugars and insulin doses, discussing about CGMs and advantages of the Accu-Chek [employer] expert and coordinating her care. We also designed a new treatment regimen to avoid hypo- and hyper-glycemia.   Office Visit on 10/17/2014  Component Date Value Ref Range Status  . Hgb A1c MFr Bld 10/17/2014 10.0* 4.6 - 6.5 % Final   Glycemic Control Guidelines for People with Diabetes:Non Diabetic:  <6%Goal of Therapy: <7%Additional Action Suggested:  >8%   .  Sodium 10/17/2014 136  135 - 145 mEq/L Final  . Potassium 10/17/2014 4.0  3.5 - 5.1 mEq/L Final  . Chloride 10/17/2014 102  96 - 112 mEq/L Final  . CO2 10/17/2014 27  19 - 32 mEq/L Final  . Glucose, Bld 10/17/2014 210* 70 - 99 mg/dL Final  . BUN 10/17/2014 15  6 - 23 mg/dL Final  . Creatinine, Ser 10/17/2014 0.68  0.40 - 1.20 mg/dL Final  . Total Bilirubin 10/17/2014 0.4  0.2 - 1.2 mg/dL Final  . Alkaline Phosphatase 10/17/2014 74  39 - 117 U/L Final  . AST 10/17/2014 23  0 - 37 U/L Final  . ALT 10/17/2014 26  0 -  35 U/L Final  . Total Protein 10/17/2014 7.6  6.0 - 8.3 g/dL Final  . Albumin 10/17/2014 4.1  3.5 - 5.2 g/dL Final  . Calcium 10/17/2014 9.2  8.4 - 10.5 mg/dL Final  . GFR 10/17/2014 103.36  >60.00 mL/min Final  . Cholesterol 10/17/2014 191  0 - 200 mg/dL Final   ATP III Classification       Desirable:  < 200 mg/dL               Borderline High:  200 - 239 mg/dL          High:  > = 240 mg/dL  . Triglycerides 10/17/2014 48.0  0.0 - 149.0 mg/dL Final   Normal:  <150 mg/dLBorderline High:  150 - 199 mg/dL  . HDL 10/17/2014 83.40  >39.00 mg/dL Final  . VLDL 10/17/2014 9.6  0.0 - 40.0 mg/dL Final  . LDL Cholesterol 10/17/2014 98  0 - 99 mg/dL Final  . Total CHOL/HDL Ratio 10/17/2014 2   Final                  Men          Women1/2 Average Risk     3.4          3.3Average Risk          5.0          4.42X Average Risk          9.6          7.13X Average Risk          15.0          11.0                      . NonHDL 10/17/2014 107.60   Final   NOTE:  Non-HDL goal should be 30 mg/dL higher than patient's LDL goal (i.e. LDL goal of < 70 mg/dL, would have non-HDL goal of < 100 mg/dL)  . TSH 10/17/2014 1.51  0.35 - 4.50 uIU/mL Final  . Microalb, Ur 10/17/2014 1.2  0.0 - 1.9 mg/dL Final  . Creatinine,U 10/17/2014 208.4   Final  . Microalb Creat Ratio 10/17/2014 0.6  0.0 - 30.0 mg/g Final   Lipid panel, ACR, TSH, GFR, LFTs, all at goal. Glucose and hemoglobin A1c high.

## 2014-11-21 ENCOUNTER — Encounter: Payer: Self-pay | Admitting: Internal Medicine

## 2014-12-01 ENCOUNTER — Ambulatory Visit: Payer: Self-pay | Admitting: Internal Medicine

## 2014-12-04 ENCOUNTER — Encounter: Payer: Self-pay | Admitting: Family Medicine

## 2014-12-04 ENCOUNTER — Ambulatory Visit (INDEPENDENT_AMBULATORY_CARE_PROVIDER_SITE_OTHER): Payer: 59 | Admitting: Family Medicine

## 2014-12-04 VITALS — BP 90/64 | HR 79 | Temp 98.4°F | Ht 65.0 in | Wt 166.5 lb

## 2014-12-04 DIAGNOSIS — S86012A Strain of left Achilles tendon, initial encounter: Secondary | ICD-10-CM

## 2014-12-04 NOTE — Progress Notes (Signed)
Pre visit review using our clinic review tool, if applicable. No additional management support is needed unless otherwise documented below in the visit note. 

## 2014-12-04 NOTE — Progress Notes (Signed)
Dr. Karleen Hampshire T. Dustin Bumbaugh, MD, CAQ Sports Medicine Primary Care and Sports Medicine [address] [place], [address] Phone: [phone] Fax: [phone]  12/04/2014  Patient: Ellen [name], MRN: [id number], DOB: 1976-03-15, 39 y.o.  Primary Physician:  Nicki Reaper, NP  Chief Complaint: Ankle Pain and Leg Swelling  Subjective:   Ellen [name] is a 39 y.o. very pleasant female patient who presents with the following:  Very pleasant patient who has a history of type 1 diabetes that is been ongoing for approximately 7-9 years. Recently, over the weekend she started a walking program where she was walking 30 minutes at a time. She did this for 3 days in a row, and subsequently she twisted her ankle and has developed some posterior pain in the Achilles tendon. She is having trouble plantar flexing when walking and she has altered her gait. She also has had some swelling in the lower extremity on the size, which also has significant varicose veins. She has had some distal tingling.  R posterior achilles.  Twisted in the achilles then and going numb some.   R achilles on the R.   Lab Results  Component Value Date   HGBA1C 10.0* 10/17/2014    Past Medical History, Surgical History, Social History, Family History, Problem List, Medications, and Allergies have been reviewed and updated if relevant.  Patient Active Problem List   Diagnosis Date Noted  . DM type 1 (diabetes mellitus, type 1) 08/02/2013    Past Medical History  Diagnosis Date  . Hyperlipidemia   . DM (diabetes mellitus), type 1   . Frequent headaches     Past Surgical History  Procedure Laterality Date  . Cesarean section  7/04,9/07.4/09    History   Social History  . Marital Status: Married    Spouse Name: N/A  . Number of Children: N/A  . Years of Education: N/A   Occupational History  . Not on file.   Social History Main Topics  . Smoking status: Never Smoker   . Smokeless tobacco: Never Used  .  Alcohol Use: No  . Drug Use: No  . Sexual Activity: Yes   Other Topics Concern  . Not on file   Social History Narrative    Family History  Problem Relation Age of Onset  . Hyperlipidemia Mother   . Cancer Father     prostate  . Arthritis Maternal Grandmother   . Hyperlipidemia Maternal Grandmother   . Hypertension Maternal Grandmother   . Stroke Maternal Grandfather   . Hypertension Maternal Grandfather   . Stroke Paternal Grandmother   . Diabetes Paternal Grandmother     No Known Allergies  Medication list reviewed and updated in full in [facility].  GEN: No fevers, chills. Nontoxic. Primarily MSK c/o today. MSK: Detailed in the HPI GI: tolerating PO intake without difficulty Neuro: as above Otherwise the pertinent positives of the ROS are noted above.   Objective:   BP 90/64 mmHg  Pulse 79  Temp(Src) 98.4 F (36.9 C) (Oral)  Ht 5\' 5"  (1.651 m)  Wt 166 lb 8 oz (75.524 kg)  BMI 27.71 kg/m2  LMP 11/28/2014   GEN: Well-developed,well-nourished,in no acute distress; alert,appropriate and cooperative throughout examination HEENT: Normocephalic and atraumatic without obvious abnormalities. Ears, externally no deformities PULM: Breathing comfortably in no respiratory distress EXT: No clubbing, cyanosis, or edema PSYCH: Normally interactive. Cooperative during the interview. Pleasant. Friendly and conversant. Not anxious or depressed appearing. Normal, full affect.  Foot: L Echymosis: no Edema: no ROM: full LE B Gait: heel toe, antalgic with plantarflexion MT pain: no Callus pattern: none Lateral Mall: NT Medial Mall: NT Talus: NT Navicular: NT Cuboid: NT Calcaneous: NT Metatarsals: NT 5th MT: NT Phalanges: NT Achilles: PAINFUL TO PALPATE 1 cm caudal to insertion and above this Plantar Fascia: NT Fat Pad: NT Peroneals: NT Post Tib: NT Great Toe: Nml motion Ant Drawer: neg ATFL: NT CFL: NT Deltoid: NT Sensation: intact    Radiology: Diagnostic Ultrasound Evaluation Terason t3000, MSK ultrasound, MSK probe Anatomy scanned: L Achilles Indication: Pain Findings: Clearly on transverse view and to a lesser degree on longitudinal view, there is an area of hypoechoic change caudal to the Achilles insertion and this corresponds to the area of maximal tenderness. The Achilles is intact. There is also a tract of hypoechoic change deep to the Achilles in this area that also goes caudally corresponding to the area of tenderness. Clinically this would be consistent with partial thickness tearing of the Achilles tendon and tracking fluid deep to the tendon.Electronically Signed  By: Hannah Beat, MD On: 12/04/2014 5:38 PM   Assessment and Plan:   Partial Achilles tendon tear, left, initial encounter   >25 minutes spent in face to face time with patient, >50% spent in counselling or coordination of care: This will need to be protected. I discussed risks with this injury including completion to a full-thickness tear. Recommended placing the patient in a Cam Walker boot for protection with a heel left. She tells me that she has one at home that her daughter has used. I gave her some orthopedic felt for a heel left. I am going to recheck her in one month's time. If at any point she feels like her Cam Walker boot is not fitting appropriately, I recommended that she come in to see Mrs. Loring for fitting of a Aircast pneumatic boot.  Ultimately, we will need to get her to do some Achilles rehabilitation when this has calmed down.  Follow-up: Return in about 1 month (around 01/03/2015).  Signed,  Elpidio Galea. Debria Broecker, MD   Patient's Medications  New Prescriptions   No medications on file  Previous Medications   GLUCAGON (GLUCAGON EMERGENCY) 1 MG INJECTION    Inject 1 mg into the muscle once as needed.   INSULIN DETEMIR (LEVEMIR FLEXTOUCH) 100 UNIT/ML PEN    Inject 12 units 2x a day under skin   INSULIN LISPRO (HUMALOG) 100  UNIT/ML KIWKPEN    Inject under skin up to 25 units daily as advised  Modified Medications   No medications on file  Discontinued Medications   CIPROFLOXACIN (CIPRO) 500 MG TABLET    Take 1 tablet (500 mg total) by mouth 2 (two) times daily.

## 2014-12-05 ENCOUNTER — Ambulatory Visit: Payer: 59 | Admitting: Family Medicine

## 2014-12-08 ENCOUNTER — Telehealth: Payer: Self-pay

## 2014-12-08 NOTE — Telephone Encounter (Signed)
Pt walked in with cam walker and pt is not sure fitting appropriately; per Dr Patsy Lager instruction on 12/04/14 visit pt came by to see Lupita Leash CMA about possible fitting for aircast boot. Lupita Leash will see pt ASAP; pt said she had to be at jury duty at 1 PM and would cb to let Lupita Leash know when she could return to ck boot.

## 2014-12-08 NOTE — Telephone Encounter (Signed)
Ellen [name] walked in while I was in the middle of clinic with Dr. Patsy Lager.  I was unable to go out and speak with her before she had to leave for [employer].  She does not need an aircast, we just need to make sure that the boot she already had at home fits appropriately which any one here in the clinic should be able to do.

## 2014-12-09 ENCOUNTER — Telehealth: Payer: Self-pay | Admitting: Internal Medicine

## 2014-12-09 NOTE — Telephone Encounter (Signed)
Opened error

## 2014-12-10 ENCOUNTER — Encounter: Payer: Self-pay | Admitting: Family Medicine

## 2014-12-10 ENCOUNTER — Ambulatory Visit (INDEPENDENT_AMBULATORY_CARE_PROVIDER_SITE_OTHER): Payer: 59 | Admitting: Family Medicine

## 2014-12-10 VITALS — BP 94/60 | HR 85 | Temp 98.3°F | Ht 65.0 in | Wt 170.0 lb

## 2014-12-10 DIAGNOSIS — S86012A Strain of left Achilles tendon, initial encounter: Secondary | ICD-10-CM

## 2014-12-10 DIAGNOSIS — R202 Paresthesia of skin: Secondary | ICD-10-CM | POA: Diagnosis not present

## 2014-12-10 NOTE — Progress Notes (Signed)
Pre visit review using our clinic review tool, if applicable. No additional management support is needed unless otherwise documented below in the visit note. 

## 2014-12-10 NOTE — Progress Notes (Signed)
Dr. Karleen Hampshire T. Joan Avetisyan, MD, CAQ Sports Medicine Primary Care and Sports Medicine [address] [place], [address] Phone: [phone] Fax: [phone]  12/10/2014  Patient: Ellen [name], MRN: [id number], DOB: February 12, 1976, 39 y.o.  Primary Physician:  Nicki Reaper, NP  Chief Complaint: Numbness and Foot Pain  Subjective:   Ellen [name] is a 39 y.o. very pleasant female patient who presents with the following:  F/u partial achilles with some question: I saw the patient last week, and felt like the patient had a probable partial Achilles tear with confirmation on ultrasound.  We placed her in a cam walker boot, and she has been having some swelling intermittently at the end of the day, and she is having some tingling sometimes in her left foot.  She wanted me to check this and also to check and make sure her cam walker boot was feeling okay.  Past Medical History, Surgical History, Social History, Family History, Problem List, Medications, and Allergies have been reviewed and updated if relevant.  Patient Active Problem List   Diagnosis Date Noted  . DM type 1 (diabetes mellitus, type 1) 08/02/2013    Past Medical History  Diagnosis Date  . Hyperlipidemia   . DM (diabetes mellitus), type 1   . Frequent headaches     Past Surgical History  Procedure Laterality Date  . Cesarean section  7/04,9/07.4/09    History   Social History  . Marital Status: Married    Spouse Name: N/A  . Number of Children: N/A  . Years of Education: N/A   Occupational History  . Not on file.   Social History Main Topics  . Smoking status: Never Smoker   . Smokeless tobacco: Never Used  . Alcohol Use: No  . Drug Use: No  . Sexual Activity: Yes   Other Topics Concern  . Not on file   Social History Narrative    Family History  Problem Relation Age of Onset  . Hyperlipidemia Mother   . Cancer Father     prostate  . Arthritis Maternal Grandmother   . Hyperlipidemia Maternal  Grandmother   . Hypertension Maternal Grandmother   . Stroke Maternal Grandfather   . Hypertension Maternal Grandfather   . Stroke Paternal Grandmother   . Diabetes Paternal Grandmother     No Known Allergies  Medication list reviewed and updated in full in [facility].  GEN: No fevers, chills. Nontoxic. Primarily MSK c/o today. MSK: Detailed in the HPI GI: tolerating PO intake without difficulty Neuro: as above Otherwise the pertinent positives of the ROS are noted above.   Objective:   BP 94/60 mmHg  Pulse 85  Temp(Src) 98.3 F (36.8 C) (Oral)  Ht 5\' 5"  (1.651 m)  Wt 170 lb (77.111 kg)  BMI 28.29 kg/m2  LMP 11/28/2014   GEN: WDWN, NAD, Non-toxic, Alert & Oriented x 3 HEENT: Atraumatic, Normocephalic.  Ears and Nose: No external deformity. EXTR: No clubbing/cyanosis/edema NEURO: Normal gait. In walker boot  PSYCH: Normally interactive. Conversant. Not depressed or anxious appearing.  Calm demeanor.    The patient is Arty less tender in the region where she had her partial tear of her Achilles.  There is some mild swelling.  Motor function is preserved.  Homans test is negative.  Radiology: No results found.  Assessment and Plan:   Partial Achilles tendon tear, left, initial encounter  Paresthesia of left foot  Healing quite well, clinically doing well.  Cam walker fits  appropriately.  It is the slightest bit too small, but certainly fits her foot, and I would not give her a new one was she had significant problems or wear issues along her leg.  The ankle appears locked in and stable in this boot.  Some tingling is to be expected. Reassured.  Follow-up: as scheduled  New Prescriptions   No medications on file   No orders of the defined types were placed in this encounter.    Signed,  Elpidio Galea. Luverna Degenhart, MD   Patient's Medications  New Prescriptions   No medications on file  Previous Medications   GLUCAGON (GLUCAGON EMERGENCY) 1 MG INJECTION     Inject 1 mg into the muscle once as needed.   INSULIN DETEMIR (LEVEMIR FLEXTOUCH) 100 UNIT/ML PEN    Inject 12 units 2x a day under skin   INSULIN LISPRO (HUMALOG) 100 UNIT/ML KIWKPEN    Inject under skin up to 25 units daily as advised  Modified Medications   No medications on file  Discontinued Medications   No medications on file

## 2014-12-22 ENCOUNTER — Other Ambulatory Visit: Payer: Self-pay

## 2015-01-05 ENCOUNTER — Encounter: Payer: Self-pay | Admitting: Family Medicine

## 2015-01-05 ENCOUNTER — Ambulatory Visit (INDEPENDENT_AMBULATORY_CARE_PROVIDER_SITE_OTHER): Payer: 59 | Admitting: Family Medicine

## 2015-01-05 ENCOUNTER — Ambulatory Visit: Payer: 59 | Admitting: Family Medicine

## 2015-01-05 VITALS — BP 80/50 | HR 85 | Temp 97.9°F | Ht 65.0 in | Wt 167.5 lb

## 2015-01-05 DIAGNOSIS — S86011D Strain of right Achilles tendon, subsequent encounter: Secondary | ICD-10-CM

## 2015-01-05 NOTE — Patient Instructions (Signed)
START DOING THIS IN ABOUT 2 WEEKS:  Achilles Rehab  Begin with easy walking, heel, toe and backwards  Calf raises on a step First lower and then raise on 1 foot If this is painful lower on 1 foot but do the heel raise on both feet  Begin with 3 sets of 10 repetitions  Increase by 5 repetitions every 3 days  Goal is 3 sets of 30 repetitions  Do with both knee straight and knee at 20 degrees of flexion  If pain persists at 3 sets of 30 - add backpack with 5 lbs Increase by 5 lbs per week to max of 30 lbs

## 2015-01-05 NOTE — Progress Notes (Signed)
Dr. Karleen HampshireSpencer T. Zelie Asbill, MD, CAQ Sports Medicine Primary Care and Sports Medicine [address] [place], [address] Phone: [phone] Fax: [phone]  01/05/2015  Patient: Ellen FontDana C Sibert, MRN: [id number], DOB: 04/05/1976, 39 y.o.  Primary Physician:  Nicki ReaperBAITY, REGINA, NP  Chief Complaint: Follow-up  Subjective:   Ellen FontDana C Kardell is a 39 y.o. very pleasant female patient who presents with the following:  F/u R achilles tendon partial tear. She is doing very well, this point she is having minimal pain only minimal. The numbness that she was experiencing is completely gone at this point. She has been in her Cam Conservation officer, natureWalker boot for 4 weeks and has been very compliant.  Doing much better minimal pain  12/10/2014 Last OV with Hannah BeatSpencer Dominyck Reser, MD  F/u partial achilles with some question: I saw the patient last week, and felt like the patient had a probable partial Achilles tear with confirmation on ultrasound.  We placed her in a cam walker boot, and she has been having some swelling intermittently at the end of the day, and she is having some tingling sometimes in her left foot.  She wanted me to check this and also to check and make sure her cam walker boot was feeling okay.  Past Medical History, Surgical History, Social History, Family History, Problem List, Medications, and Allergies have been reviewed and updated if relevant.  Patient Active Problem List   Diagnosis Date Noted  . DM type 1 (diabetes mellitus, type 1) 08/02/2013    Past Medical History  Diagnosis Date  . Hyperlipidemia   . DM (diabetes mellitus), type 1   . Frequent headaches     Past Surgical History  Procedure Laterality Date  . Cesarean section  7/04,9/07.4/09    History   Social History  . Marital Status: Married    Spouse Name: N/A  . Number of Children: N/A  . Years of Education: N/A   Occupational History  . Not on file.   Social History Main Topics  . Smoking status: Never Smoker   .  Smokeless tobacco: Never Used  . Alcohol Use: No  . Drug Use: No  . Sexual Activity: Yes   Other Topics Concern  . Not on file   Social History Narrative    Family History  Problem Relation Age of Onset  . Hyperlipidemia Mother   . Cancer Father     prostate  . Arthritis Maternal Grandmother   . Hyperlipidemia Maternal Grandmother   . Hypertension Maternal Grandmother   . Stroke Maternal Grandfather   . Hypertension Maternal Grandfather   . Stroke Paternal Grandmother   . Diabetes Paternal Grandmother     No Known Allergies  Medication list reviewed and updated in full in [facility].  GEN: No fevers, chills. Nontoxic. Primarily MSK c/o today. MSK: Detailed in the HPI GI: tolerating PO intake without difficulty Neuro: as above Otherwise the pertinent positives of the ROS are noted above.   Objective:   BP 80/50 mmHg  Pulse 85  Temp(Src) 97.9 F (36.6 C) (Oral)  Ht 5\' 5"  (1.651 m)  Wt 167 lb 8 oz (75.978 kg)  BMI 27.87 kg/m2  LMP 12/20/2014   GEN: WDWN, NAD, Non-toxic, Alert & Oriented x 3 HEENT: Atraumatic, Normocephalic.  Ears and Nose: No external deformity. EXTR: No clubbing/cyanosis/edema NEURO: Normal gait. PSYCH: Normally interactive. Conversant. Not depressed or anxious appearing.  Calm demeanor.    Minimal to non-tender along the achilles. No defect, no bump  palpable.  Homans test is negative.  Radiology: No results found.  Assessment and Plan:   Partial Achilles tendon tear, right, subsequent encounter  The patient looks great. Recommended weaning out of her Cam Walker boot over the next week, and progress with rehabilitation in approximately 10-14 days.  Patient Instructions  START DOING THIS IN ABOUT 2 WEEKS:  Achilles Rehab  Begin with easy walking, heel, toe and backwards  Calf raises on a step First lower and then raise on 1 foot If this is painful lower on 1 foot but do the heel raise on both feet  Begin with 3 sets of 10  repetitions  Increase by 5 repetitions every 3 days  Goal is 3 sets of 30 repetitions  Do with both knee straight and knee at 20 degrees of flexion  If pain persists at 3 sets of 30 - add backpack with 5 lbs Increase by 5 lbs per week to max of 30 lbs      Follow-up: prn  Signed,  Kofi Murrell T. Sherlynn Tourville, MD   Patient's Medications  New Prescriptions   No medications on file  Previous Medications   GLUCAGON (GLUCAGON EMERGENCY) 1 MG INJECTION    Inject 1 mg into the muscle once as needed.   INSULIN DETEMIR (LEVEMIR FLEXTOUCH) 100 UNIT/ML PEN    Inject 12 units 2x a day under skin   INSULIN LISPRO (HUMALOG) 100 UNIT/ML KIWKPEN    Inject under skin up to 25 units daily as advised  Modified Medications   No medications on file  Discontinued Medications   No medications on file

## 2015-01-05 NOTE — Progress Notes (Signed)
Pre visit review using our clinic review tool, if applicable. No additional management support is needed unless otherwise documented below in the visit note. 

## 2015-03-18 ENCOUNTER — Ambulatory Visit (INDEPENDENT_AMBULATORY_CARE_PROVIDER_SITE_OTHER): Payer: 59 | Admitting: Internal Medicine

## 2015-03-18 ENCOUNTER — Encounter: Payer: Self-pay | Admitting: Internal Medicine

## 2015-03-18 VITALS — BP 106/72 | HR 74 | Temp 98.2°F | Wt 167.0 lb

## 2015-03-18 DIAGNOSIS — R590 Localized enlarged lymph nodes: Secondary | ICD-10-CM

## 2015-03-18 DIAGNOSIS — R599 Enlarged lymph nodes, unspecified: Secondary | ICD-10-CM

## 2015-03-18 LAB — CBC WITH DIFFERENTIAL/PLATELET
BASOS PCT: 0.9 % (ref 0.0–3.0)
Basophils Absolute: 0.1 10*3/uL (ref 0.0–0.1)
EOS PCT: 1.4 % (ref 0.0–5.0)
Eosinophils Absolute: 0.1 10*3/uL (ref 0.0–0.7)
HEMATOCRIT: 38.8 % (ref 36.0–46.0)
HEMOGLOBIN: 12.9 g/dL (ref 12.0–15.0)
LYMPHS PCT: 19.3 % (ref 12.0–46.0)
Lymphs Abs: 1.4 10*3/uL (ref 0.7–4.0)
MCHC: 33.3 g/dL (ref 30.0–36.0)
MCV: 84.8 fl (ref 78.0–100.0)
MONOS PCT: 5.1 % (ref 3.0–12.0)
Monocytes Absolute: 0.4 10*3/uL (ref 0.1–1.0)
NEUTROS ABS: 5.2 10*3/uL (ref 1.4–7.7)
Neutrophils Relative %: 73.3 % (ref 43.0–77.0)
PLATELETS: 314 10*3/uL (ref 150.0–400.0)
RBC: 4.58 Mil/uL (ref 3.87–5.11)
RDW: 13.4 % (ref 11.5–15.5)
WBC: 7.1 10*3/uL (ref 4.0–10.5)

## 2015-03-18 NOTE — Patient Instructions (Signed)

## 2015-03-18 NOTE — Progress Notes (Signed)
Pre visit review using our clinic review tool, if applicable. No additional management support is needed unless otherwise documented below in the visit note. 

## 2015-03-18 NOTE — Progress Notes (Signed)
Subjective:    Patient ID: Ellen [name], female    DOB: 1975-11-08, 39 y.o.   MRN: [id number]  HPI  Pt presents to the clinic today with c/o a mass behind her left ear and on the back of the right side of her head. She noticed these about 2 months ago. They are tender at times but not all the times. She denies any recent cold or other illness. She denies fatigue, bruising or shortness of breath. She has not tried anything OTC. She is very concerned that she may had lymphoma.  Review of Systems      Past Medical History  Diagnosis Date  . Hyperlipidemia   . DM (diabetes mellitus), type 1   . Frequent headaches     Current Outpatient Prescriptions  Medication Sig Dispense Refill  . glucagon (GLUCAGON EMERGENCY) 1 MG injection Inject 1 mg into the muscle once as needed. 2 each 12  . Insulin Detemir (LEVEMIR FLEXTOUCH) 100 UNIT/ML Pen Inject 12 units 2x a day under skin 15 mL 2  . insulin lispro (HUMALOG) 100 UNIT/ML KiwkPen Inject under skin up to 25 units daily as advised 15 mL 2   No current facility-administered medications for this visit.    No Known Allergies  Family History  Problem Relation Age of Onset  . Hyperlipidemia Mother   . Cancer Father     prostate  . Arthritis Maternal Grandmother   . Hyperlipidemia Maternal Grandmother   . Hypertension Maternal Grandmother   . Stroke Maternal Grandfather   . Hypertension Maternal Grandfather   . Stroke Paternal Grandmother   . Diabetes Paternal Grandmother     Social History   Social History  . Marital Status: Married    Spouse Name: N/A  . Number of Children: N/A  . Years of Education: N/A   Occupational History  . Not on file.   Social History Main Topics  . Smoking status: Never Smoker   . Smokeless tobacco: Never Used  . Alcohol Use: No  . Drug Use: No  . Sexual Activity: Yes   Other Topics Concern  . Not on file   Social History Narrative     Constitutional: Denies fever, malaise, fatigue,  headache or abrupt weight changes.  HEENT: Denies eye pain, eye redness, ear pain, ringing in the ears, wax buildup, runny nose, nasal congestion, bloody nose, or sore throat. Respiratory: Denies difficulty breathing, shortness of breath, cough or sputum production.   Cardiovascular: Denies chest pain, chest tightness, palpitations or swelling in the hands or feet.  Skin: Denies redness, rashes, lesions or ulcercations.  Neurological: Denies dizziness, difficulty with memory, difficulty with speech or problems with balance and coordination.    No other specific complaints in a complete review of systems (except as listed in HPI above).  Objective:   Physical Exam  BP 106/72 mmHg  Pulse 74  Temp(Src) 98.2 F (36.8 C) (Oral)  Wt 167 lb (75.751 kg)  SpO2 99%  LMP 03/15/2015 Wt Readings from Last 3 Encounters:  03/18/15 167 lb (75.751 kg)  01/05/15 167 lb 8 oz (75.978 kg)  12/10/14 170 lb (77.111 kg)    General: Appears her stated age, well developed, well nourished in NAD. Skin: Warm, dry and intact. Oval, < 1 cm soft, mobile lymph node on left posterior occiput. Oval, < 1 cm soft, mobile lymph node on right posterior occiput.  HEENT: Head: normal shape and size; Eyes: sclera white, no icterus, conjunctiva pink; Ears: Tm's gray and  intact, normal light reflex;  Neck:  No cervical adenopathy noted. Cardiovascular: Normal rate and rhythm. S1,S2 noted.  No murmur, rubs or gallops noted.  Pulmonary/Chest: Normal effort and positive vesicular breath sounds. No respiratory distress. No wheezes, rales or ronchi noted.  Neurological: Alert and oriented.   BMET    Component Value Date/Time   NA 136 10/17/2014 1048   NA 140 03/21/2013   K 4.0 10/17/2014 1048   CL 102 10/17/2014 1048   CO2 27 10/17/2014 1048   GLUCOSE 210* 10/17/2014 1048   BUN 15 10/17/2014 1048   BUN 14 03/21/2013   CREATININE 0.68 10/17/2014 1048   CREATININE 0.7 03/21/2013   CALCIUM 9.2 10/17/2014 1048    GFRNONAA >60 10/18/2007 1053   GFRAA  10/18/2007 1053    >60        The eGFR has been calculated using the MDRD equation. This calculation has not been validated in all clinical    Lipid Panel     Component Value Date/Time   CHOL 191 10/17/2014 1048   TRIG 48.0 10/17/2014 1048   HDL 83.40 10/17/2014 1048   CHOLHDL 2 10/17/2014 1048   VLDL 9.6 10/17/2014 1048   LDLCALC 98 10/17/2014 1048    CBC    Component Value Date/Time   WBC 10.3 10/20/2007 0600   RBC 3.37* 10/20/2007 0600   HGB 10.1* 10/20/2007 0600   HCT 28.9* 10/20/2007 0600   PLT 195 10/20/2007 0600   MCV 85.8 10/20/2007 0600   MCHC 34.8 10/20/2007 0600   RDW 13.3 10/20/2007 0600    Hgb A1C Lab Results  Component Value Date   HGBA1C 10.0* 10/17/2014         Assessment & Plan:   Occipital Lymphadenopathy:  Reassurance given that I do not think this is lymphoma Will check CBC with diff If they persist and get larger, consider ultrasound Will continue to monitor at this time  Will follow up after labs, RTC as needed

## 2015-04-17 ENCOUNTER — Other Ambulatory Visit: Payer: Self-pay | Admitting: *Deleted

## 2015-04-17 MED ORDER — INSULIN DETEMIR 100 UNIT/ML FLEXPEN: PEN_INJECTOR | SUBCUTANEOUS | Status: DC

## 2015-04-17 MED ORDER — INSULIN LISPRO 100 UNIT/ML (KWIKPEN): PEN_INJECTOR | SUBCUTANEOUS | Status: DC

## 2015-05-12 ENCOUNTER — Ambulatory Visit (INDEPENDENT_AMBULATORY_CARE_PROVIDER_SITE_OTHER): Payer: 59 | Admitting: Internal Medicine

## 2015-05-12 ENCOUNTER — Encounter: Payer: Self-pay | Admitting: Internal Medicine

## 2015-05-12 VITALS — BP 104/70 | HR 72 | Temp 98.0°F | Wt 166.0 lb

## 2015-05-12 DIAGNOSIS — R350 Frequency of micturition: Secondary | ICD-10-CM

## 2015-05-12 DIAGNOSIS — N3289 Other specified disorders of bladder: Secondary | ICD-10-CM

## 2015-05-12 DIAGNOSIS — R3989 Other symptoms and signs involving the genitourinary system: Secondary | ICD-10-CM

## 2015-05-12 LAB — POCT URINALYSIS DIPSTICK
BILIRUBIN UA: NEGATIVE
Ketones, UA: NEGATIVE
Leukocytes, UA: NEGATIVE
NITRITE UA: NEGATIVE
Protein, UA: NEGATIVE
Spec Grav, UA: 1.025
Urobilinogen, UA: NEGATIVE
pH, UA: 6

## 2015-05-12 MED ORDER — CEPHALEXIN 500 MG PO CAPS: 500.0000 mg | ORAL_CAPSULE | Freq: Two times a day (BID) | ORAL | Status: DC

## 2015-05-12 NOTE — Progress Notes (Signed)
Pre visit review using our clinic review tool, if applicable. No additional management support is needed unless otherwise documented below in the visit note. 

## 2015-05-12 NOTE — Addendum Note (Signed)
Addended by: Roena MaladyEVONTENNO, Jamin Humphries Y on: 05/12/2015 03:11 PM   Modules accepted: Orders

## 2015-05-12 NOTE — Progress Notes (Signed)
   Subjective:    Patient ID: Ellen [name], female    DOB: 03/03/1976, 39 y.o.   MRN: [id number]  HPI  Ellen [name] is a 39 year old female who presents today with chief complaint of dysuria and frequency for one week.  She has taken Azo OTC with minimal relief, she has just taken one dose yesterday. Denies fever, chills. No CVA tenderness.   Review of Systems  Constitutional: Negative for fever, chills and fatigue.  HENT: Negative.   Respiratory: Negative for cough, shortness of breath and wheezing.   Cardiovascular: Negative for chest pain, palpitations and leg swelling.  Gastrointestinal: Negative for diarrhea, constipation and abdominal distention.  Genitourinary: Positive for dysuria and frequency. Negative for hematuria and flank pain.   Family History  Problem Relation Age of Onset  . Hyperlipidemia Mother   . Cancer Father     prostate  . Arthritis Maternal Grandmother   . Hyperlipidemia Maternal Grandmother   . Hypertension Maternal Grandmother   . Stroke Maternal Grandfather   . Hypertension Maternal Grandfather   . Stroke Paternal Grandmother   . Diabetes Paternal Grandmother    Current Outpatient Prescriptions on File Prior to Visit  Medication Sig Dispense Refill  . glucagon (GLUCAGON EMERGENCY) 1 MG injection Inject 1 mg into the muscle once as needed. 2 each 12  . Insulin Detemir (LEVEMIR FLEXTOUCH) 100 UNIT/ML Pen Inject 12 units 2x a day under skin. **PT NEEDS APPT WITH DR GHERGHE** 15 mL 0  . insulin lispro (HUMALOG) 100 UNIT/ML KiwkPen Inject under skin up to 25 units daily as advised. **PT NEEDS APPT WITH DR GHERGHE** 15 mL 0   No current facility-administered medications on file prior to visit.       Objective:   Physical Exam  Constitutional: She is oriented to person, place, and time. She appears well-developed and well-nourished.  HENT:  Head: Normocephalic and atraumatic.  Neck: Normal range of motion. Neck supple.  Cardiovascular: Normal rate,  regular rhythm and normal heart sounds.   Pulmonary/Chest: Effort normal and breath sounds normal.  Abdominal: Soft. Bowel sounds are normal. There is no tenderness.  Musculoskeletal: Normal range of motion.  Neurological: She is alert and oriented to person, place, and time.  Skin: Skin is warm and dry.     BP 104/70 mmHg  Pulse 72  Temp(Src) 98 F (36.7 C) (Oral)  Wt 166 lb (75.297 kg)  SpO2 99%  LMP 05/04/2015      Assessment & Plan:  1. UTI Urine is positive for blood.  Patient states that she has had 5 UTI's in the past year or so and all the UA's are negative, but the cultures come back with positive E. Coli.  Will go ahead and rx for Cephalexin and send urine for culture.

## 2015-05-12 NOTE — Progress Notes (Signed)
HPI  Pt presents to the clinic today with c/o urinary urgency and bladder pressure. This started 1 week ago. She denies fever, chill or body aches. She has had 4-5 UTI's in the last year and her urinalysis never show anything but the culture grows back [place]. She has not tried anything OTC.   Review of Systems  Past Medical History  Diagnosis Date  . Hyperlipidemia   . DM (diabetes mellitus), type 1 (HCC)   . Frequent headaches     Family History  Problem Relation Age of Onset  . Hyperlipidemia Mother   . Cancer Father     prostate  . Arthritis Maternal Grandmother   . Hyperlipidemia Maternal Grandmother   . Hypertension Maternal Grandmother   . Stroke Maternal Grandfather   . Hypertension Maternal Grandfather   . Stroke Paternal Grandmother   . Diabetes Paternal Grandmother     Social History   Social History  . Marital Status: Married    Spouse Name: N/A  . Number of Children: N/A  . Years of Education: N/A   Occupational History  . Not on file.   Social History Main Topics  . Smoking status: Never Smoker   . Smokeless tobacco: Never Used  . Alcohol Use: No  . Drug Use: No  . Sexual Activity: Yes   Other Topics Concern  . Not on file   Social History Narrative    No Known Allergies  Constitutional: Denies fever, malaise, fatigue, headache or abrupt weight changes.   GU: Pt reports urgency. Denies frequency, dysuria, burning sensation, blood in urine, odor or discharge. Skin: Denies redness, rashes, lesions or ulcercations.   No other specific complaints in a complete review of systems (except as listed in HPI above).    Objective:   Physical Exam  BP 104/70 mmHg  Pulse 72  Temp(Src) 98 F (36.7 C) (Oral)  Wt 166 lb (75.297 kg)  SpO2 99%  LMP 05/04/2015  Wt Readings from Last 3 Encounters:  05/12/15 166 lb (75.297 kg)  03/18/15 167 lb (75.751 kg)  01/05/15 167 lb 8 oz (75.978 kg)    General: Appears her stated age, well developed, well  nourished in NAD. Cardiovascular: Normal rate and rhythm. S1,S2 noted.   Pulmonary/Chest: Normal effort and positive vesicular breath sounds. No respiratory distress. No wheezes, rales or ronchi noted.  Abdomen: Soft. Normal bowel sounds. No CVA tenderness.      Assessment & Plan:   Urgency and bladder pressure:  Urinalysis: trace blood, 3+ glucose Will send urine culture eRx for Keflex 500 mg BID x 5 days OK to take AZO OTC Drink plenty of fluids  RTC as needed or if symptoms persist.

## 2015-05-12 NOTE — Patient Instructions (Signed)

## 2015-05-14 LAB — URINE CULTURE: Colony Count: >=100000

## 2015-05-18 ENCOUNTER — Encounter: Payer: Self-pay | Admitting: Internal Medicine

## 2015-06-19 ENCOUNTER — Ambulatory Visit (INDEPENDENT_AMBULATORY_CARE_PROVIDER_SITE_OTHER): Payer: 59 | Admitting: Internal Medicine

## 2015-06-19 ENCOUNTER — Encounter: Payer: Self-pay | Admitting: Internal Medicine

## 2015-06-19 VITALS — BP 120/70 | HR 88 | Temp 97.8°F | Wt 170.0 lb

## 2015-06-19 DIAGNOSIS — J0100 Acute maxillary sinusitis, unspecified: Secondary | ICD-10-CM | POA: Diagnosis not present

## 2015-06-19 MED ORDER — DOXYCYCLINE HYCLATE 100 MG PO TABS: 100.0000 mg | ORAL_TABLET | Freq: Two times a day (BID) | ORAL | Status: DC

## 2015-06-19 NOTE — Progress Notes (Signed)
Pre visit review using our clinic review tool, if applicable. No additional management support is needed unless otherwise documented below in the visit note. 

## 2015-06-19 NOTE — Progress Notes (Signed)
HPI  Pt presents to the clinic today with c/o headache, nasal congestion, ear fullness and cough. This started 1 week ago but has gotten worse in the last 3 days. She is not able to blow anything out of her nose. The cough is nonproductive. She denies fever but has had chills and body aches. She has no history of seasonal allergies or breathing problems that she is aware of. She has had sick contacts.  Review of Systems      Past Medical History  Diagnosis Date  . Hyperlipidemia   . DM (diabetes mellitus), type 1 (HCC)   . Frequent headaches     Family History  Problem Relation Age of Onset  . Hyperlipidemia Mother   . Cancer Father     prostate  . Arthritis Maternal Grandmother   . Hyperlipidemia Maternal Grandmother   . Hypertension Maternal Grandmother   . Stroke Maternal Grandfather   . Hypertension Maternal Grandfather   . Stroke Paternal Grandmother   . Diabetes Paternal Grandmother     Social History   Social History  . Marital Status: Married    Spouse Name: N/A  . Number of Children: N/A  . Years of Education: N/A   Occupational History  . Not on file.   Social History Main Topics  . Smoking status: Never Smoker   . Smokeless tobacco: Never Used  . Alcohol Use: No  . Drug Use: No  . Sexual Activity: Yes   Other Topics Concern  . Not on file   Social History Narrative    No Known Allergies   Constitutional: Positive headache, fatigue. Denies fever or abrupt weight changes.  HEENT:  Positive nasal congestion, sore throat. Denies eye redness, eye pain, pressure behind the eyes, facial pain, ear pain, ringing in the ears, wax buildup, runny nose or bloody nose. Respiratory: Positive cough. Denies difficulty breathing or shortness of breath.  Cardiovascular: Denies chest pain, chest tightness, palpitations or swelling in the hands or feet.   No other specific complaints in a complete review of systems (except as listed in HPI above).  Objective:    BP 120/70 mmHg  Pulse 88  Temp(Src) 97.8 F (36.6 C) (Tympanic)  Wt 170 lb (77.111 kg)  SpO2 99%  Wt Readings from Last 3 Encounters:  05/12/15 166 lb (75.297 kg)  03/18/15 167 lb (75.751 kg)  01/05/15 167 lb 8 oz (75.978 kg)     General: Appears her stated age, well developed, well nourished in NAD. HEENT: Head: normal shape and size, maxillary sinus tenderness noted; Eyes: sclera white, no icterus, conjunctiva pink; Ears: Tm's pink but intact, normal light reflex, +seourous effusion bilaterally; Nose: mucosa boggy and moist, septum midline; Throat/Mouth: + PND. Teeth present, mucosa erythematous and moist, no exudate noted, no lesions or ulcerations noted.  Neck: Cervical lymphadenopathy, R>L.  Cardiovascular: Normal rate and rhythm. S1,S2 noted.  No murmur, rubs or gallops noted.  Pulmonary/Chest: Normal effort and positive vesicular breath sounds. No respiratory distress. No wheezes, rales or ronchi noted.      Assessment & Plan:   Acute Maxillary Sinusitis:  Get some rest and drink plenty of water Do salt water gargles for the sore throat eRx for Doxycycline 100 mg BID x 10 days Flonase 1 spray each nostril daily x 1 week  RTC as needed or if symptoms persist.

## 2015-06-19 NOTE — Patient Instructions (Signed)

## 2015-07-02 ENCOUNTER — Encounter: Payer: Self-pay | Admitting: Internal Medicine

## 2015-07-03 ENCOUNTER — Encounter: Payer: Self-pay | Admitting: Internal Medicine

## 2015-07-03 MED ORDER — INSULIN DETEMIR 100 UNIT/ML FLEXPEN: PEN_INJECTOR | SUBCUTANEOUS | Status: DC

## 2015-08-27 ENCOUNTER — Ambulatory Visit (INDEPENDENT_AMBULATORY_CARE_PROVIDER_SITE_OTHER): Payer: 59 | Admitting: Internal Medicine

## 2015-08-27 ENCOUNTER — Encounter: Payer: Self-pay | Admitting: Internal Medicine

## 2015-08-27 VITALS — BP 104/74 | HR 73 | Temp 98.2°F | Wt 168.0 lb

## 2015-08-27 DIAGNOSIS — E1065 Type 1 diabetes mellitus with hyperglycemia: Secondary | ICD-10-CM | POA: Diagnosis not present

## 2015-08-27 MED ORDER — INSULIN LISPRO 100 UNIT/ML (KWIKPEN): PEN_INJECTOR | SUBCUTANEOUS | Status: DC

## 2015-08-27 MED ORDER — INSULIN DETEMIR 100 UNIT/ML FLEXPEN: PEN_INJECTOR | SUBCUTANEOUS | Status: DC

## 2015-08-27 NOTE — Progress Notes (Signed)
Pre visit review using our clinic review tool, if applicable. No additional management support is needed unless otherwise documented below in the visit note. 

## 2015-08-27 NOTE — Patient Instructions (Signed)
Type 1 Diabetes Mellitus, Adult Type 1 diabetes mellitus, often simply referred to as diabetes, is a long-term (chronic) disease. It occurs when the islet cells in the pancreas that make insulin (a hormone) are destroyed and can no longer make insulin. Insulin is needed to move sugars from food into the tissue cells. The tissue cells use the sugars for energy. In people with type 1 diabetes, the sugars build up in the blood instead of going into the tissue cells. As a result, high blood sugar (hyperglycemia) develops. Without insulin, the body breaks down fat cells for the needed energy. This breakdown of fat cells produces acid chemicals (ketones), which increases the acid levels in the body. The effect of either high ketone or high sugar (glucose) levels can be life-threatening.  Type 1 diabetes was also previously called juvenile diabetes. It most often occurs before the age of 30, but it can occur at any age. RISK FACTORS A person is predisposed to developing type 1 diabetes if someone in his or her family has the disease and is exposed to certain additional environmental triggers.  SYMPTOMS  Symptoms of type 1 diabetes may develop gradually over days to weeks or suddenly. The symptoms occur due to hyperglycemia. The symptoms can include:   Increased thirst (polydipsia).  Increased urination (polyuria).  Increased urination during the night (nocturia).  Weight loss. This weight loss may be rapid.  Frequent, recurring infections.  Tiredness (fatigue).  Weakness.  Vision changes, such as blurred vision.  Fruity smell to your breath.  Abdominal pain.  Nausea or vomiting.  An open skin wound (ulcer). DIAGNOSIS  Type 1 diabetes is diagnosed when symptoms of diabetes are present and when blood glucose levels are increased. Your blood glucose level may be checked by one or more of the following blood tests:  A fasting blood glucose test. You will not be allowed to eat for at least 8  hours before a blood sample is taken.  A random blood glucose test. Your blood glucose is checked at any time of the day regardless of when you ate.  A hemoglobin A1c blood glucose test. A hemoglobin A1c test provides information about blood glucose control over the previous 3 months. TREATMENT  Although type 1 diabetes cannot be prevented, it can be managed with insulin, diet, and exercise.  You will need to take insulin daily to keep blood glucose in the desired range.  You will need to match insulin dosing with exercise and healthy food choices. Generally, the goal of treatment is to maintain a pre-meal (preprandial) blood glucose level of 80-130 mg/dL. HOME CARE INSTRUCTIONS   Have your hemoglobin A1c level checked twice a year.  Perform daily blood glucose monitoring as directed by your health care provider.  Monitor urine ketones when you are ill and as directed by your health care provider.  Take your insulin as directed by your health care provider to maintain your blood glucose level in the desired range.  Never run out of insulin. It is needed every day.  Adjust insulin based on your intake of carbohydrates. Carbohydrates can raise blood glucose levels but need to be included in your diet. Carbohydrates provide vitamins, minerals, and fiber, which are an essential part of a healthy diet. Carbohydrates are found in fruits, vegetables, whole grains, dairy products, legumes, and foods containing added sugars.  Eat healthy foods. Alternate 3 meals with 3 snacks.  Maintain a healthy weight.  Carry a medical alert card or wear your medical alert   jewelry.  Carry a 15-gram carbohydrate snack with you at all times to treat low blood glucose (hypoglycemia). Some examples of 15-gram carbohydrate snacks include:  Glucose tablets, 3 or 4.  Glucose gel, 15-gram tube.  Raisins, 2 tablespoons (24 grams).  Jelly beans, 6.  Animal crackers, 8.  Fruit juice, regular soda, or  low-fat milk, 4 ounces (120 mL).  Gummy treats, 9.  Recognize hypoglycemia. Hypoglycemia occurs with blood glucose levels of 70 mg/dL and below. The risk for hypoglycemia increases when fasting or skipping meals, during or after intense exercise, and during sleep. Hypoglycemia symptoms can include:  Tremors or shakes.  Decreased ability to concentrate.  Sweating.  Increased heart rate.  Headache.  Dry mouth.  Hunger.  Irritability.  Anxiety.  Restless sleep.  Altered speech or coordination.  Confusion.  Treat hypoglycemia promptly. If you are alert and able to safely swallow, follow the 15:15 rule:  Take 15-20 grams of rapid-acting glucose or carbohydrate. Rapid-acting options include glucose gel, glucose tablets, or 4 ounces (120 mL) of fruit juice, regular soda, or low-fat milk.  Check your blood glucose level 15 minutes after taking the glucose.  Take 15-20 grams more of glucose if the repeat blood glucose level is still 70 mg/dL or below.  Eat a meal or snack within 1 hour once blood glucose levels return to normal.  Be alert to polyuria and polydipsia, which are early signs of hyperglycemia. An early awareness of hyperglycemia allows for prompt treatment. Treat hyperglycemia as directed by your health care provider.  Exercise regularly as directed by your health care provider. This includes:  Stretching and performing strength training exercises, such as yoga or weight lifting, at least 2 times per week.  Performing a total of at least 150 minutes of moderate-intensity exercise each week, such as brisk walking or water aerobics.  Exercising at least 3 days per week, making sure you allow no more than 2 consecutive days to pass without exercising.  Avoiding long periods of inactivity (90 minutes or more). When you have to spend an extended period of time sitting down, take frequent breaks to walk or stretch.  Adjust your insulin dosing and food intake as needed  if you start a new exercise or sport.  Follow your sick-day plan at any time you are unable to eat or drink as usual.   Do not use any tobacco products including cigarettes, chewing tobacco, or electronic cigarettes. If you need help quitting, ask your health care provider.  Limit alcohol intake to no more than 1 drink per day for nonpregnant women and 2 drinks per day for men. You should drink alcohol only when you are also eating food. Talk with your health care provider about whether alcohol is safe for you. Tell your health care provider if you drink alcohol several times a week.  Keep all follow-up visits as directed by your health care provider.  Schedule an eye exam within 5 years of diagnosis and then annually.  Perform daily skin and foot care. Examine your skin and feet daily for cuts, bruises, redness, nail problems, bleeding, blisters, or sores. A foot exam should be done by a health care provider 5 years after diagnosis, and then every year after the first exam.  Brush your teeth and gums at least twice a day and floss at least once a day. Follow up with your dentist regularly.  Share your diabetes management plan with your workplace or school.  Keep your immunizations up to date. It   is recommended that you receive a flu (influenza) vaccine every year. It is also recommended that you receive a pneumonia (pneumococcal) vaccine. If you are 65 years of age or older and have never received a pneumonia vaccine, this vaccine may be given as a series of two separate shots. Ask your health care provider which additional vaccines may be recommended.  Learn to manage stress.  Obtain ongoing diabetes education and support as needed.  Participate in or seek rehabilitation as needed to maintain or improve independence and quality of life. Request a physical or occupational therapy referral if you are having foot or hand numbness, or difficulties with grooming, dressing, eating, or physical  activity. SEEK MEDICAL CARE IF:   You are unable to eat food or drink fluids for more than 6 hours.  You have nausea and vomiting for more than 6 hours.  Your blood glucose level is over 240 mg/dL.  There is a change in mental status.  You develop an additional serious illness.  You have diarrhea for more than 6 hours.  You have been sick or have had a fever for a couple of days and are not getting better.  You have pain during any physical activity. SEEK IMMEDIATE MEDICAL CARE IF:  You have difficulty breathing.  You have moderate to large ketone levels. MAKE SURE YOU:  Understand these instructions.  Will watch your condition.  Will get help right away if you are not doing well or get worse.   This information is not intended to replace advice given to you by your health care provider. Make sure you discuss any questions you have with your health care provider.   Document Released: 06/10/2000 Document Revised: 03/04/2015 Document Reviewed: 01/10/2012 Elsevier Interactive Patient Education 2016 [employer]  

## 2015-08-27 NOTE — Assessment & Plan Note (Signed)
Encouraged her to consume a low fat, low carb diet Exercise to help keep sugar down Will check A1C and Lipid Profile today Continue Levemir and Humalog Continue yearly eye exams She does not take flu or pneumonia vaccines  Foot exam today

## 2015-08-27 NOTE — Progress Notes (Signed)
Subjective:    Patient ID: Ellen [name], female    DOB: [birth year], 40 y.o.   MRN: [id number]  HPI  Pt presents to the clinic today to follow up on DM 1. She was seeing Dr. Cruzita Lederer but reports her copays are very expensive and she can not afford to go there anymore. Her last A1C was 10%. She is currently taking Levemir and Humalog. Her sugars range 70-300. Her last eye exam 09/2014. Flu 2014. Pneumovax never.  Review of Systems      Past Medical History  Diagnosis Date  . Hyperlipidemia   . DM (diabetes mellitus), type 1 ([place])   . Frequent headaches     Current Outpatient Prescriptions  Medication Sig Dispense Refill  . glucagon (GLUCAGON EMERGENCY) 1 MG injection Inject 1 mg into the muscle once as needed. 2 each 12  . Insulin Detemir (LEVEMIR FLEXTOUCH) 100 UNIT/ML Pen Inject 12 units 2x a day under skin. **PT NEEDS APPT WITH DR GHERGHE** 15 mL 0  . insulin lispro (HUMALOG) 100 UNIT/ML KiwkPen Inject under skin up to 25 units daily as advised. **PT NEEDS APPT WITH DR GHERGHE** 15 mL 0   No current facility-administered medications for this visit.    No Known Allergies  Family History  Problem Relation Age of Onset  . Hyperlipidemia Mother   . Cancer Father     prostate  . Arthritis Maternal Grandmother   . Hyperlipidemia Maternal Grandmother   . Hypertension Maternal Grandmother   . Stroke Maternal Grandfather   . Hypertension Maternal Grandfather   . Stroke Paternal Grandmother   . Diabetes Paternal Grandmother     Social History   Social History  . Marital Status: Married    Spouse Name: N/A  . Number of Children: N/A  . Years of Education: N/A   Occupational History  . Not on file.   Social History Main Topics  . Smoking status: Never Smoker   . Smokeless tobacco: Never Used  . Alcohol Use: No  . Drug Use: No  . Sexual Activity: Yes   Other Topics Concern  . Not on file   Social History Narrative     Constitutional: Denies fever, malaise,  fatigue, headache or abrupt weight changes.  HEENT: Pt reports blurred vision. Denies eye pain, eye redness, ear pain, ringing in the ears, wax buildup, runny nose, nasal congestion, bloody nose, or sore throat. Respiratory: Denies difficulty breathing, shortness of breath, cough or sputum production.   Cardiovascular: Denies chest pain, chest tightness, palpitations or swelling in the hands or feet.  Gastrointestinal: Denies abdominal pain, bloating, constipation, diarrhea or blood in the stool.  GU: Denies urgency, frequency, pain with urination, burning sensation, blood in urine, odor or discharge. Skin: Denies redness, rashes, lesions or ulcercations.  Neurological: Denies dizziness, difficulty with memory, difficulty with speech or problems with balance and coordination.  Psych: Denies anxiety, depression, SI/HI.  No other specific complaints in a complete review of systems (except as listed in HPI above).  Objective:   Physical Exam  BP 104/74 mmHg  Pulse 73  Temp(Src) 98.2 F (36.8 C) (Oral)  Wt 168 lb (76.204 kg)  SpO2 99%  LMP 08/26/2015 Wt Readings from Last 3 Encounters:  08/27/15 168 lb (76.204 kg)  06/19/15 170 lb (77.111 kg)  05/12/15 166 lb (75.297 kg)    General: Appears her stated age,  in NAD. Skin: Warm, dry and intact. No rashes, lesions or ulcerations noted. HEENT: Head: normal shape and size; Eyes:  sclera white, no icterus, conjunctiva pink, PERRLA and EOMs intact;  Cardiovascular: Normal rate and rhythm. S1,S2 noted.  No murmur, rubs or gallops noted.  Pulmonary/Chest: Normal effort and positive vesicular breath sounds. No respiratory distress. No wheezes, rales or ronchi noted.   Neurological: Alert and oriented.   BMET    Component Value Date/Time   NA 136 10/17/2014 1048   NA 140 03/21/2013   K 4.0 10/17/2014 1048   CL 102 10/17/2014 1048   CO2 27 10/17/2014 1048   GLUCOSE 210* 10/17/2014 1048   BUN 15 10/17/2014 1048   BUN 14 03/21/2013    CREATININE 0.68 10/17/2014 1048   CREATININE 0.7 03/21/2013   CALCIUM 9.2 10/17/2014 1048   GFRNONAA >60 10/18/2007 1053   GFRAA  10/18/2007 1053    >60        The eGFR has been calculated using the MDRD equation. This calculation has not been validated in all clinical    Lipid Panel     Component Value Date/Time   CHOL 191 10/17/2014 1048   TRIG 48.0 10/17/2014 1048   HDL 83.40 10/17/2014 1048   CHOLHDL 2 10/17/2014 1048   VLDL 9.6 10/17/2014 1048   LDLCALC 98 10/17/2014 1048    CBC    Component Value Date/Time   WBC 7.1 03/18/2015 1434   RBC 4.58 03/18/2015 1434   HGB 12.9 03/18/2015 1434   HCT 38.8 03/18/2015 1434   PLT 314.0 03/18/2015 1434   MCV 84.8 03/18/2015 1434   MCHC 33.3 03/18/2015 1434   RDW 13.4 03/18/2015 1434   LYMPHSABS 1.4 03/18/2015 1434   MONOABS 0.4 03/18/2015 1434   EOSABS 0.1 03/18/2015 1434   BASOSABS 0.1 03/18/2015 1434    Hgb A1C Lab Results  Component Value Date   HGBA1C 10.0* 10/17/2014         Assessment & Plan:

## 2015-08-28 LAB — LIPID PANEL
Cholesterol: 175 mg/dL (ref 0–200)
HDL: 72.4 mg/dL (ref >39.00)
LDL Cholesterol: 92 mg/dL (ref 0–99)
NONHDL: 102.59
TRIGLYCERIDES: 54 mg/dL (ref 0.0–149.0)
Total CHOL/HDL Ratio: 2
VLDL: 10.8 mg/dL (ref 0.0–40.0)

## 2015-08-28 LAB — HEMOGLOBIN A1C: HEMOGLOBIN A1C: 10 % — AB (ref 4.6–6.5)

## 2015-10-01 ENCOUNTER — Encounter: Payer: Self-pay | Admitting: Internal Medicine

## 2015-10-01 ENCOUNTER — Ambulatory Visit (INDEPENDENT_AMBULATORY_CARE_PROVIDER_SITE_OTHER): Payer: 59 | Admitting: Internal Medicine

## 2015-10-01 VITALS — BP 106/70 | HR 80 | Temp 98.7°F | Wt 164.0 lb

## 2015-10-01 DIAGNOSIS — E1065 Type 1 diabetes mellitus with hyperglycemia: Secondary | ICD-10-CM | POA: Diagnosis not present

## 2015-10-01 DIAGNOSIS — R1032 Left lower quadrant pain: Secondary | ICD-10-CM | POA: Diagnosis not present

## 2015-10-01 NOTE — Progress Notes (Signed)
Subjective:    Patient ID: Ellen [name], female    DOB: 06/23/1976, 40 y.o.   MRN: [id number]  HPI  Pt presents to the clinic today with c/o LLQ pain. This started 6 months ago. It is intermittent. She describes the pain as a pinching/pulling at the end of her c-section scar.  The pain is associated with movement, especically standing up and sitting down. She has a h/o 4 c-sections, and had a similar pain after her last c-section 9 years ago. She was told at the time that her pain was most likely due to scar tissue. She denies N/V/D/C, blood in the stool, urinary sxs, or fevers. She has a h/o IBS, but says this feels different. She has not started any new exercise or diet regimens. Her pain is not correlated to menstruation.   Additionally, she reports her insurance is requiring that she switch her insulin from Levemir to Basalar. Pt requests that when she needs a refill, to prescribe Basalar.   Review of Systems  Past Medical History  Diagnosis Date  . Hyperlipidemia   . DM (diabetes mellitus), type 1 ([place])   . Frequent headaches     Current Outpatient Prescriptions  Medication Sig Dispense Refill  . glucagon (GLUCAGON EMERGENCY) 1 MG injection Inject 1 mg into the muscle once as needed. 2 each 12  . Insulin Detemir (LEVEMIR FLEXTOUCH) 100 UNIT/ML Pen Inject 12 units 2x a day under skin. 15 mL 5  . insulin lispro (HUMALOG) 100 UNIT/ML KiwkPen Inject under skin up to 25 units daily as advised. 15 mL 5   No current facility-administered medications for this visit.    No Known Allergies  Family History  Problem Relation Age of Onset  . Hyperlipidemia Mother   . Cancer Father     prostate  . Arthritis Maternal Grandmother   . Hyperlipidemia Maternal Grandmother   . Hypertension Maternal Grandmother   . Stroke Maternal Grandfather   . Hypertension Maternal Grandfather   . Stroke Paternal Grandmother   . Diabetes Paternal Grandmother     Social History   Social History  .  Marital Status: Married    Spouse Name: N/A  . Number of Children: N/A  . Years of Education: N/A   Occupational History  . Not on file.   Social History Main Topics  . Smoking status: Never Smoker   . Smokeless tobacco: Never Used  . Alcohol Use: No  . Drug Use: No  . Sexual Activity: Yes   Other Topics Concern  . Not on file   Social History Narrative     Constitutional: Denies fever or abrupt weight changes.  Respiratory: Denies difficulty breathing, or shortness of breath Cardiovascular: Denies chest pain, chest tightness, or palpitations  Gastrointestinal: Denies bloating, constipation, diarrhea or blood in the stool.  GU: Denies urgency, frequency, pain with urination, burning sensation, blood in urine, odor or discharge. Skin: Denies redness, rashes, lesions or ulcercations.  MSK: Positive for a stretching/pulling pain at the L end of c-section scar with a bump at the site.  No other specific complaints in a complete review of systems (except as listed in HPI above).     Objective:   Physical Exam BP 106/70 mmHg  Pulse 80  Temp(Src) 98.7 F (37.1 C) (Oral)  Wt 164 lb (74.39 kg)  SpO2 98%  LMP 09/23/2015 Wt Readings from Last 3 Encounters:  10/01/15 164 lb (74.39 kg)  08/27/15 168 lb (76.204 kg)  06/19/15 170 lb (77.111  kg)    General: Appears her stated age, well developed, well nourished in NAD. Skin: Warm, dry and intact.   Cardiovascular: Normal rate and rhythm. S1,S2 noted.  No murmur, rubs or gallops noted. Pulmonary/Chest: Normal effort and positive vesicular breath sounds. No respiratory distress. No wheezes, rales or ronchi noted.  Abdomen: Soft and nontender. Normal bowel sounds.  Starting midline of c-section scar and continuing out to th end of the scar, pt has a firm mass consistent with adhesions. No tenderness with palpation of scar or mass. No redness or swelling at the site. No bulging of the mass with abdominal flexion or cough. No increased  pain with leg flexion against resistance.    BMET    Component Value Date/Time   NA 136 10/17/2014 1048   NA 140 03/21/2013   K 4.0 10/17/2014 1048   CL 102 10/17/2014 1048   CO2 27 10/17/2014 1048   GLUCOSE 210* 10/17/2014 1048   BUN 15 10/17/2014 1048   BUN 14 03/21/2013   CREATININE 0.68 10/17/2014 1048   CREATININE 0.7 03/21/2013   CALCIUM 9.2 10/17/2014 1048   GFRNONAA >60 10/18/2007 1053   GFRAA  10/18/2007 1053    >60        The eGFR has been calculated using the MDRD equation. This calculation has not been validated in all clinical    Lipid Panel     Component Value Date/Time   CHOL 175 08/27/2015 1554   TRIG 54.0 08/27/2015 1554   HDL 72.40 08/27/2015 1554   CHOLHDL 2 08/27/2015 1554   VLDL 10.8 08/27/2015 1554   LDLCALC 92 08/27/2015 1554    CBC    Component Value Date/Time   WBC 7.1 03/18/2015 1434   RBC 4.58 03/18/2015 1434   HGB 12.9 03/18/2015 1434   HCT 38.8 03/18/2015 1434   PLT 314.0 03/18/2015 1434   MCV 84.8 03/18/2015 1434   MCHC 33.3 03/18/2015 1434   RDW 13.4 03/18/2015 1434   LYMPHSABS 1.4 03/18/2015 1434   MONOABS 0.4 03/18/2015 1434   EOSABS 0.1 03/18/2015 1434   BASOSABS 0.1 03/18/2015 1434    Hgb A1C Lab Results  Component Value Date   HGBA1C 10.0* 08/27/2015        Assessment & Plan:   Left lower quadrant pain:  Likely r/t scar tissue/adhesions OTC pain relief prn  If worsens, can get ultrasound of area Offered referral to surgery, but pt declined   Diabetes Type 1:  When Levemir Rx ends, prescribe Basalar at 1:1.   RTC as needed or if symptoms persist or worsen

## 2015-10-01 NOTE — Progress Notes (Signed)
Pre visit review using our clinic review tool, if applicable. No additional management support is needed unless otherwise documented below in the visit note. 

## 2015-10-01 NOTE — Patient Instructions (Signed)
Insulin Detemir injection What is this medicine? INSULIN DETEMIR (IN su lin DE te mir) is a human-made form of insulin. This drug lowers the amount of sugar in your blood. It is a long-acting insulin that is usually given once or twice a day. This medicine may be used for other purposes; ask your health care provider or pharmacist if you have questions. What should I tell my health care provider before I take this medicine? They need to know if you have any of these conditions: -episodes of hypoglycemia -kidney disease -liver disease -an unusual or allergic reaction to insulin, metacresol, other medicines, foods, dyes, or preservatives -pregnant or trying to get pregnant -breast-feeding How should I use this medicine? This medicine is for injection under the skin. Take this medicine at the same time(s) each day. Use exactly as directed. This insulin should never be mixed in the same syringe with other insulins before injection. Do not vigorously shake insulin before use. You will be taught how to adjust doses for activities and illness. Do not use more insulin than prescribed. Do not use more or less often than prescribed. Always check the appearance of your insulin before using it. This medicine should be clear and colorless like water. Do not use if it is cloudy, thickened, colored, or has solid particles in it. It is important that you put your used needles and syringes in a special sharps container. Do not put them in a trash can. If you do not have a sharps container, call your pharmacist or healthcare provider to get one. Talk to your pediatrician regarding the use of this medicine in children. While this drug may be prescribed for children as young as 2 years for selected conditions, precautions do apply. Overdosage: If you think you have taken too much of this medicine contact a poison control center or emergency room at once. NOTE: This medicine is only for you. Do not share this medicine  with others. What if I miss a dose? It is important not to miss a dose. Your health care professional or doctor should discuss a plan for missed doses with you. If you do miss a dose, follow their plan. Do not take double doses. What may interact with this medicine? -other medicines for diabetes Many medications may cause an increase or decrease in blood sugar, these include: -alcohol containing beverages -aspirin and aspirin-like drugs -chloramphenicol -chromium -diuretics -female hormones, like estrogens or progestins and birth control pills -heart medicines -isoniazid -MAOIs like Carbex, Eldepryl, Marplan, Nardil, and Parnate -female hormones or anabolic steroids -medicines for weight loss -medicines for allergies, asthma, cold, or cough -medicines for mental problems -niacin -NSAIDs, medicines for pain and inflammation, like ibuprofen or naproxen -pentamidine -phenytoin -probenecid -quinolone antibiotics like ciprofloxacin, levofloxacin, ofloxacin -some herbal dietary supplements -steroid medicines like prednisone or cortisone -thyroid medicine Some medications can hide the warning symptoms of low blood sugar. You may need to monitor your blood sugar more closely if you are taking one of these medications. These include: -beta-blockers such as atenolol, metoprolol, propranolol -clonidine -guanethidine -reserpine This list may not describe all possible interactions. Give your health care provider a list of all the medicines, herbs, non-prescription drugs, or dietary supplements you use. Also tell them if you smoke, drink alcohol, or use illegal drugs. Some items may interact with your medicine. What should I watch for while using this medicine? Visit your health care professional or doctor for regular checks on your progress. A test called the HbA1C (A1C) will  be monitored. This is a simple blood test. It measures your blood sugar control over the last 2 to 3 months. You will  receive this test every 3 to 6 months. Learn how to check your blood sugar. Learn the symptoms of low and high blood sugar and how to manage them. Always carry a quick-source of sugar with you in case you have symptoms of low blood sugar. Examples include hard sugar candy or glucose tablets. Make sure others know that you can choke if you eat or drink when you develop serious symptoms of low blood sugar, such as seizures or unconsciousness. They must get medical help at once. Tell your doctor or health care professional if you have high blood sugar. You might need to change the dose of your medicine. If you are sick or exercising more than usual, you might need to change the dose of your medicine. Do not skip meals. Ask your doctor or health care professional if you should avoid alcohol. Many nonprescription cough and cold products contain sugar or alcohol. These can affect blood sugar. Make sure that you have the right kind of syringe for the type of insulin you use. Try not to change the brand and type of insulin or syringe unless your health care professional or doctor tells you to. Switching insulin brand or type can cause dangerously high or low blood sugar. Always keep an extra supply of insulin, syringes, and needles on hand. Use a syringe one time only. Throw away syringe and needle in a closed container to prevent accidental needle sticks. Insulin pens and cartridges should never be shared. Even if the needle is changed, sharing may result in passing of viruses like hepatitis or HIV. Wear a medical ID bracelet or chain, and carry a card that describes your disease and details of your medicine and dosage times. What side effects may I notice from receiving this medicine? Side effects that you should report to your health care professional or doctor as soon as possible: -allergic reactions like skin rash, itching or hives, swelling of the face, lips, or tongue -breathing problems -signs and  symptoms of high blood sugar such as dizziness, dry mouth, dry skin, fruity breath, nausea, stomach pain, increased hunger or thirst, increased urination -signs and symptoms of low blood sugar such as feeling anxious, confusion, dizziness, increased hunger, unusually weak or tired, sweating, shakiness, cold, irritable, headache, blurred vision, fast heartbeat, loss of consciousness Side effects that usually do not require medical attention (report to your health care professional or doctor if they continue or are bothersome): -increase or decrease in fatty tissue under the skin due to overuse of a particular injection site -itching, burning, swelling, or rash at site where injected This list may not describe all possible side effects. Call your doctor for medical advice about side effects. You may report side effects to FDA at 1-800-FDA-1088. Where should I keep my medicine? Keep out of the reach of children. Store [employer] in a refrigerator between 2 and 8 degrees C (36 and 46 degrees F.) Do not freeze or use if the insulin has been frozen. Once opened, the pens should be kept at room temperature, below 30 degrees C (86 degrees F). Do not store in the refrigerator once opened. Once opened, the insulin can be used for 42 days. After 42 days, the Flextouch Pen should be thrown away. Store unopened insulin vials in a refrigerator between 2 and 8 degrees C (36 and 46 degrees F). Do not  freeze or use if the insulin has been frozen. Opened vials (vials currently in use) should be stored in a refrigerator, never a freezer. If refrigeration is not possible, the opened vial can be stored unrefrigerated at room temperature, below 30 degrees C (86 degrees F) for up to 42 days. After 42 days, the vial of insulin should be thrown away. Keeping your insulin at room temperature decreases the amount of pain during injection. Protect from light and excessive heat. Throw away any unused medicine after the expiration  date or after the specified time for room temperature storage has passed. NOTE: This sheet is a summary. It may not cover all possible information. If you have questions about this medicine, talk to your doctor, pharmacist, or health care provider.    2016, [employer]. (2013-08-22 10:14:51)

## 2015-11-10 ENCOUNTER — Encounter: Payer: Self-pay | Admitting: Internal Medicine

## 2015-11-10 ENCOUNTER — Other Ambulatory Visit: Payer: Self-pay | Admitting: Internal Medicine

## 2015-11-10 MED ORDER — BASAGLAR KWIKPEN 100 UNIT/ML ~~LOC~~ SOPN: 12.0000 [IU] | PEN_INJECTOR | Freq: Two times a day (BID) | SUBCUTANEOUS | Status: DC

## 2015-11-11 ENCOUNTER — Ambulatory Visit: Payer: 59 | Admitting: Family Medicine

## 2016-02-05 ENCOUNTER — Ambulatory Visit (INDEPENDENT_AMBULATORY_CARE_PROVIDER_SITE_OTHER): Payer: 59 | Admitting: Internal Medicine

## 2016-02-05 ENCOUNTER — Encounter: Payer: Self-pay | Admitting: Internal Medicine

## 2016-02-05 VITALS — BP 108/70 | HR 77 | Temp 98.7°F | Wt 170.0 lb

## 2016-02-05 DIAGNOSIS — R8290 Unspecified abnormal findings in urine: Secondary | ICD-10-CM

## 2016-02-05 DIAGNOSIS — R350 Frequency of micturition: Secondary | ICD-10-CM

## 2016-02-05 DIAGNOSIS — R8299 Other abnormal findings in urine: Secondary | ICD-10-CM | POA: Diagnosis not present

## 2016-02-05 LAB — POC URINALSYSI DIPSTICK (AUTOMATED)
BILIRUBIN UA: NEGATIVE
Blood, UA: NEGATIVE
Glucose, UA: 2000
Leukocytes, UA: NEGATIVE
Nitrite, UA: POSITIVE
Protein, UA: NEGATIVE
SPEC GRAV UA: 1.02
Urobilinogen, UA: NEGATIVE
pH, UA: 6

## 2016-02-05 MED ORDER — CIPROFLOXACIN HCL 500 MG PO TABS: 500.0000 mg | ORAL_TABLET | Freq: Two times a day (BID) | ORAL | 0 refills | Status: DC

## 2016-02-05 NOTE — Addendum Note (Signed)
Addended by: Roena MaladyEVONTENNO, MELANIE Y on: 02/05/2016 03:30 PM   Modules accepted: Orders

## 2016-02-05 NOTE — Progress Notes (Signed)
   Subjective:    Patient ID: Ellen [name], female    DOB: 12/12/1975, 40 y.o.   MRN: [id number]  HPI  Pt presents to the clinic today with a complaint of urinary frequency x 1 day.  She reports strong urine odor, cloudy appearance, and a feeling of incomplete emptying.  She denies hematuria, burning with urination, fever, chills, nausea, vomiting, abdominal pain, or back pain.  She reports a history of recurrent urinary tract infections, with the most recent UTI occurring within the past year.  She states the current symptoms feel the same as prior UTIs, and the strong odor has occurred in the past with UTIs caused by E.coli.  Review of Systems Past Medical History:  Diagnosis Date  . DM (diabetes mellitus), type 1 ([facility])   . Frequent headaches   . Hyperlipidemia    Past Surgical History:  Procedure Laterality Date  . CESAREAN SECTION  7/04,9/07.4/09   Family History  Problem Relation Age of Onset  . Hyperlipidemia Mother   . Cancer Father     prostate  . Arthritis Maternal Grandmother   . Hyperlipidemia Maternal Grandmother   . Hypertension Maternal Grandmother   . Stroke Maternal Grandfather   . Hypertension Maternal Grandfather   . Stroke Paternal Grandmother   . Diabetes Paternal Grandmother    Current Outpatient Prescriptions on File Prior to Visit  Medication Sig Dispense Refill  . glucagon (GLUCAGON EMERGENCY) 1 MG injection Inject 1 mg into the muscle once as needed. 2 each 12  . Insulin Glargine (BASAGLAR KWIKPEN) 100 UNIT/ML SOPN Inject 0.12 mLs (12 Units total) into the skin 2 (two) times daily. 15 mL 5  . insulin lispro (HUMALOG) 100 UNIT/ML KiwkPen Inject under skin up to 25 units daily as advised. 15 mL 5   No current facility-administered medications on file prior to visit.    No Known Allergies  Const: Denies fever or chills. GI: Denies nausea, vomiting, or abdominal pain. GU: Pt reports urinary frequency, odor, and cloudiness.  Denies hematuria or burning  with urination. MSK: Denies back pain.     Objective:   Physical Exam  BP 108/70   Pulse 77   Temp 98.7 F (37.1 C) (Oral)   Wt 170 lb (77.1 kg)   LMP 01/10/2016   SpO2 98%   BMI 28.29 kg/m   General: Well-appearing, in no acute distress. Pulm: Clear to auscultation bilaterally.  No wheezes, rales, or rhonchi. CV: Regular rate and rhythm.  No murmurs, rubs, or gallops. GI: Positive bowel sounds all four quadrants.  Soft, tender to palpation across lower abdomen, pt reports history of scar tissue following 4 C-sections. GU: Negative CVA tenderness.      Assessment & Plan:   Urinary frequency, urine odor, cloudy urine:  UA positive for nitrites, cloudy appearance Urine culture pending eRx for Ciprofloxacin 500 mg BID x 5 days Call if symptoms worsen or do not resolve  Tracie Lindbloom, NP

## 2016-02-05 NOTE — Progress Notes (Signed)
Pre visit review using our clinic review tool, if applicable. No additional management support is needed unless otherwise documented below in the visit note. 

## 2016-02-05 NOTE — Patient Instructions (Signed)

## 2016-02-08 LAB — URINE CULTURE

## 2016-03-07 ENCOUNTER — Encounter: Payer: 59 | Admitting: Internal Medicine

## 2016-03-22 ENCOUNTER — Ambulatory Visit (INDEPENDENT_AMBULATORY_CARE_PROVIDER_SITE_OTHER): Payer: 59 | Admitting: Internal Medicine

## 2016-03-22 ENCOUNTER — Encounter: Payer: Self-pay | Admitting: Internal Medicine

## 2016-03-22 VITALS — BP 106/70 | HR 76 | Temp 98.1°F | Ht 65.0 in | Wt 170.8 lb

## 2016-03-22 DIAGNOSIS — E109 Type 1 diabetes mellitus without complications: Secondary | ICD-10-CM

## 2016-03-22 DIAGNOSIS — Z0000 Encounter for general adult medical examination without abnormal findings: Secondary | ICD-10-CM | POA: Diagnosis not present

## 2016-03-22 LAB — COMPREHENSIVE METABOLIC PANEL
ALK PHOS: 61 U/L (ref 39–117)
ALT: 11 U/L (ref 0–35)
AST: 13 U/L (ref 0–37)
Albumin: 3.7 g/dL (ref 3.5–5.2)
BILIRUBIN TOTAL: 0.4 mg/dL (ref 0.2–1.2)
BUN: 14 mg/dL (ref 6–23)
CALCIUM: 9 mg/dL (ref 8.4–10.5)
CO2: 31 meq/L (ref 19–32)
Chloride: 102 mEq/L (ref 96–112)
Creatinine, Ser: 0.68 mg/dL (ref 0.40–1.20)
GFR: 102.57 mL/min (ref >60.00)
Glucose, Bld: 189 mg/dL — ABNORMAL HIGH (ref 70–99)
Potassium: 4.5 mEq/L (ref 3.5–5.1)
Sodium: 138 mEq/L (ref 135–145)
Total Protein: 6.9 g/dL (ref 6.0–8.3)

## 2016-03-22 LAB — CBC
HCT: 38.6 % (ref 36.0–46.0)
HEMOGLOBIN: 12.9 g/dL (ref 12.0–15.0)
MCHC: 33.5 g/dL (ref 30.0–36.0)
MCV: 84.2 fl (ref 78.0–100.0)
PLATELETS: 315 10*3/uL (ref 150.0–400.0)
RBC: 4.59 Mil/uL (ref 3.87–5.11)
RDW: 13.4 % (ref 11.5–15.5)
WBC: 6.6 10*3/uL (ref 4.0–10.5)

## 2016-03-22 LAB — LIPID PANEL
CHOL/HDL RATIO: 2
Cholesterol: 161 mg/dL (ref 0–200)
HDL: 68.9 mg/dL (ref >39.00)
LDL CALC: 80 mg/dL (ref 0–99)
NonHDL: 92.08
TRIGLYCERIDES: 60 mg/dL (ref 0.0–149.0)
VLDL: 12 mg/dL (ref 0.0–40.0)

## 2016-03-22 LAB — MICROALBUMIN / CREATININE URINE RATIO
Creatinine,U: 63.1 mg/dL
Microalb Creat Ratio: 1.1 mg/g (ref 0.0–30.0)

## 2016-03-22 LAB — HEMOGLOBIN A1C: Hgb A1c MFr Bld: 9.4 % — ABNORMAL HIGH (ref 4.6–6.5)

## 2016-03-22 MED ORDER — GLUCAGON (RDNA) 1 MG IJ KIT: 1.0000 mg | PACK | Freq: Once | INTRAMUSCULAR | 12 refills | Status: DC | PRN

## 2016-03-22 NOTE — Patient Instructions (Addendum)
Health Maintenance, Female Adopting a healthy lifestyle and getting preventive care can go a long way to promote health and wellness. Talk with your health care provider about what schedule of regular examinations is right for you. This is a good chance for you to check in with your provider about disease prevention and staying healthy. In between checkups, there are plenty of things you can do on your own. Experts have done a lot of research about which lifestyle changes and preventive measures are most likely to keep you healthy. Ask your health care provider for more information. WEIGHT AND DIET  Eat a healthy diet  Be sure to include plenty of vegetables, fruits, low-fat dairy products, and lean protein.  Do not eat a lot of foods high in solid fats, added sugars, or salt.  Get regular exercise. This is one of the most important things you can do for your health.  Most adults should exercise for at least 150 minutes each week. The exercise should increase your heart rate and make you sweat (moderate-intensity exercise).  Most adults should also do strengthening exercises at least twice a week. This is in addition to the moderate-intensity exercise.  Maintain a healthy weight  Body mass index (BMI) is a measurement that can be used to identify possible weight problems. It estimates body fat based on height and weight. Your health care provider can help determine your BMI and help you achieve or maintain a healthy weight.  For females 20 years of age and older:   A BMI below 18.5 is considered underweight.  A BMI of 18.5 to 24.9 is normal.  A BMI of 25 to 29.9 is considered overweight.  A BMI of 30 and above is considered obese.  Watch levels of cholesterol and blood lipids  You should start having your blood tested for lipids and cholesterol at 38 years of age, then have this test every 5 years.  You may need to have your cholesterol levels checked more often if:  Your lipid  or cholesterol levels are high.  You are older than 38 years of age.  You are at high risk for heart disease.  CANCER SCREENING   Lung Cancer  Lung cancer screening is recommended for adults 55-80 years old who are at high risk for lung cancer because of a history of smoking.  A yearly low-dose CT scan of the lungs is recommended for people who:  Currently smoke.  Have quit within the past 15 years.  Have at least a 30-pack-year history of smoking. A pack year is smoking an average of one pack of cigarettes a day for 1 year.  Yearly screening should continue until it has been 15 years since you quit.  Yearly screening should stop if you develop a health problem that would prevent you from having lung cancer treatment.  Breast Cancer  Practice breast self-awareness. This means understanding how your breasts normally appear and feel.  It also means doing regular breast self-exams. Let your health care provider know about any changes, no matter how small.  If you are in your 20s or 30s, you should have a clinical breast exam (CBE) by a health care provider every 1-3 years as part of a regular health exam.  If you are 40 or older, have a CBE every year. Also consider having a breast X-ray (mammogram) every year.  If you have a family history of breast cancer, talk to your health care provider about genetic screening.  If you   are at high risk for breast cancer, talk to your health care provider about having an MRI and a mammogram every year.  Breast cancer gene (BRCA) assessment is recommended for women who have family members with BRCA-related cancers. BRCA-related cancers include:  Breast.  Ovarian.  Tubal.  Peritoneal cancers.  Results of the assessment will determine the need for genetic counseling and BRCA1 and BRCA2 testing. Cervical Cancer Your health care provider may recommend that you be screened regularly for cancer of the pelvic organs (ovaries, uterus, and  vagina). This screening involves a pelvic examination, including checking for microscopic changes to the surface of your cervix (Pap test). You may be encouraged to have this screening done every 3 years, beginning at age 21.  For women ages 30-65, health care providers may recommend pelvic exams and Pap testing every 3 years, or they may recommend the Pap and pelvic exam, combined with testing for human papilloma virus (HPV), every 5 years. Some types of HPV increase your risk of cervical cancer. Testing for HPV may also be done on women of any age with unclear Pap test results.  Other health care providers may not recommend any screening for nonpregnant women who are considered low risk for pelvic cancer and who do not have symptoms. Ask your health care provider if a screening pelvic exam is right for you.  If you have had past treatment for cervical cancer or a condition that could lead to cancer, you need Pap tests and screening for cancer for at least 20 years after your treatment. If Pap tests have been discontinued, your risk factors (such as having a new sexual partner) need to be reassessed to determine if screening should resume. Some women have medical problems that increase the chance of getting cervical cancer. In these cases, your health care provider may recommend more frequent screening and Pap tests. Colorectal Cancer  This type of cancer can be detected and often prevented.  Routine colorectal cancer screening usually begins at 38 years of age and continues through 38 years of age.  Your health care provider may recommend screening at an earlier age if you have risk factors for colon cancer.  Your health care provider may also recommend using home test kits to check for hidden blood in the stool.  A small camera at the end of a tube can be used to examine your colon directly (sigmoidoscopy or colonoscopy). This is done to check for the earliest forms of colorectal  cancer.  Routine screening usually begins at age 50.  Direct examination of the colon should be repeated every 5-10 years through 38 years of age. However, you may need to be screened more often if early forms of precancerous polyps or small growths are found. Skin Cancer  Check your skin from head to toe regularly.  Tell your health care provider about any new moles or changes in moles, especially if there is a change in a mole's shape or color.  Also tell your health care provider if you have a mole that is larger than the size of a pencil eraser.  Always use sunscreen. Apply sunscreen liberally and repeatedly throughout the day.  Protect yourself by wearing long sleeves, pants, a wide-brimmed hat, and sunglasses whenever you are outside. HEART DISEASE, DIABETES, AND HIGH BLOOD PRESSURE   High blood pressure causes heart disease and increases the risk of stroke. High blood pressure is more likely to develop in:  People who have blood pressure in the high end   of the normal range (130-139/85-89 mm Hg).  People who are overweight or obese.  People who are African American.  If you are 38-23 years of age, have your blood pressure checked every 3-5 years. If you are 61 years of age or older, have your blood pressure checked every year. You should have your blood pressure measured twice--once when you are at a hospital or clinic, and once when you are not at a hospital or clinic. Record the average of the two measurements. To check your blood pressure when you are not at a hospital or clinic, you can use:  An automated blood pressure machine at a pharmacy.  A home blood pressure monitor.  If you are between 45 years and 39 years old, ask your health care provider if you should take aspirin to prevent strokes.  Have regular diabetes screenings. This involves taking a blood sample to check your fasting blood sugar level.  If you are at a normal weight and have a low risk for diabetes,  have this test once every three years after 38 years of age.  If you are overweight and have a high risk for diabetes, consider being tested at a younger age or more often. PREVENTING INFECTION  Hepatitis B  If you have a higher risk for hepatitis B, you should be screened for this virus. You are considered at high risk for hepatitis B if:  You were born in a country where hepatitis B is common. Ask your health care provider which countries are considered high risk.  Your parents were born in a high-risk country, and you have not been immunized against hepatitis B (hepatitis B vaccine).  You have HIV or AIDS.  You use needles to inject street drugs.  You live with someone who has hepatitis B.  You have had sex with someone who has hepatitis B.  You get hemodialysis treatment.  You take certain medicines for conditions, including cancer, organ transplantation, and autoimmune conditions. Hepatitis C  Blood testing is recommended for:  Everyone born from [birth year] through 1963.  Anyone with known risk factors for hepatitis C. Sexually transmitted infections (STIs)  You should be screened for sexually transmitted infections (STIs) including gonorrhea and chlamydia if:  You are sexually active and are younger than 38 years of age.  You are older than 38 years of age and your health care provider tells you that you are at risk for this type of infection.  Your sexual activity has changed since you were last screened and you are at an increased risk for chlamydia or gonorrhea. Ask your health care provider if you are at risk.  If you do not have HIV, but are at risk, it may be recommended that you take a prescription medicine daily to prevent HIV infection. This is called pre-exposure prophylaxis (PrEP). You are considered at risk if:  You are sexually active and do not regularly use condoms or know the HIV status of your partner(s).  You take drugs by injection.  You are sexually  active with a partner who has HIV. Talk with your health care provider about whether you are at high risk of being infected with HIV. If you choose to begin PrEP, you should first be tested for HIV. You should then be tested every 3 months for as long as you are taking PrEP.  PREGNANCY   If you are premenopausal and you may become pregnant, ask your health care provider about preconception counseling.  If you may  become pregnant, take 400 to 800 micrograms (mcg) of folic acid every day.  If you want to prevent pregnancy, talk to your health care provider about birth control (contraception). OSTEOPOROSIS AND MENOPAUSE   Osteoporosis is a disease in which the bones lose minerals and strength with aging. This can result in serious bone fractures. Your risk for osteoporosis can be identified using a bone density scan.  If you are 65 years of age or older, or if you are at risk for osteoporosis and fractures, ask your health care provider if you should be screened.  Ask your health care provider whether you should take a calcium or vitamin D supplement to lower your risk for osteoporosis.  Menopause may have certain physical symptoms and risks.  Hormone replacement therapy may reduce some of these symptoms and risks. Talk to your health care provider about whether hormone replacement therapy is right for you.  HOME CARE INSTRUCTIONS   Schedule regular health, dental, and eye exams.  Stay current with your immunizations.   Do not use any tobacco products including cigarettes, chewing tobacco, or electronic cigarettes.  If you are pregnant, do not drink alcohol.  If you are breastfeeding, limit how much and how often you drink alcohol.  Limit alcohol intake to no more than 1 drink per day for nonpregnant women. One drink equals 12 ounces of beer, 5 ounces of wine, or 1 ounces of hard liquor.  Do not use street drugs.  Do not share needles.  Ask your health care provider for help if  you need support or information about quitting drugs.  Tell your health care provider if you often feel depressed.  Tell your health care provider if you have ever been abused or do not feel safe at home.   This information is not intended to replace advice given to you by your health care provider. Make sure you discuss any questions you have with your health care provider.   Document Released: 12/27/2010 Document Revised: 07/04/2014 Document Reviewed: 05/15/2013 [employer] Interactive Patient Education 2016 [employer] Health Maintenance, Female Adopting a healthy lifestyle and getting preventive care can go a long way to promote health and wellness. Talk with your health care provider about what schedule of regular examinations is right for you. This is a good chance for you to check in with your provider about disease prevention and staying healthy. In between checkups, there are plenty of things you can do on your own. Experts have done a lot of research about which lifestyle changes and preventive measures are most likely to keep you healthy. Ask your health care provider for more information. WEIGHT AND DIET  Eat a healthy diet  Be sure to include plenty of vegetables, fruits, low-fat dairy products, and lean protein.  Do not eat a lot of foods high in solid fats, added sugars, or salt.  Get regular exercise. This is one of the most important things you can do for your health.  Most adults should exercise for at least 150 minutes each week. The exercise should increase your heart rate and make you sweat (moderate-intensity exercise).  Most adults should also do strengthening exercises at least twice a week. This is in addition to the moderate-intensity exercise.  Maintain a healthy weight  Body mass index (BMI) is a measurement that can be used to identify possible weight problems. It estimates body fat based on height and weight. Your health care provider can help determine your  BMI and help you achieve   or maintain a healthy weight.  For females 20 years of age and older:   A BMI below 18.5 is considered underweight.  A BMI of 18.5 to 24.9 is normal.  A BMI of 25 to 29.9 is considered overweight.  A BMI of 30 and above is considered obese.  Watch levels of cholesterol and blood lipids  You should start having your blood tested for lipids and cholesterol at 38 years of age, then have this test every 5 years.  You may need to have your cholesterol levels checked more often if:  Your lipid or cholesterol levels are high.  You are older than 38 years of age.  You are at high risk for heart disease.  CANCER SCREENING   Lung Cancer  Lung cancer screening is recommended for adults 55-80 years old who are at high risk for lung cancer because of a history of smoking.  A yearly low-dose CT scan of the lungs is recommended for people who:  Currently smoke.  Have quit within the past 15 years.  Have at least a 30-pack-year history of smoking. A pack year is smoking an average of one pack of cigarettes a day for 1 year.  Yearly screening should continue until it has been 15 years since you quit.  Yearly screening should stop if you develop a health problem that would prevent you from having lung cancer treatment.  Breast Cancer  Practice breast self-awareness. This means understanding how your breasts normally appear and feel.  It also means doing regular breast self-exams. Let your health care provider know about any changes, no matter how small.  If you are in your 20s or 30s, you should have a clinical breast exam (CBE) by a health care provider every 1-3 years as part of a regular health exam.  If you are 40 or older, have a CBE every year. Also consider having a breast X-ray (mammogram) every year.  If you have a family history of breast cancer, talk to your health care provider about genetic screening.  If you are at high risk for breast  cancer, talk to your health care provider about having an MRI and a mammogram every year.  Breast cancer gene (BRCA) assessment is recommended for women who have family members with BRCA-related cancers. BRCA-related cancers include:  Breast.  Ovarian.  Tubal.  Peritoneal cancers.  Results of the assessment will determine the need for genetic counseling and BRCA1 and BRCA2 testing. Cervical Cancer Your health care provider may recommend that you be screened regularly for cancer of the pelvic organs (ovaries, uterus, and vagina). This screening involves a pelvic examination, including checking for microscopic changes to the surface of your cervix (Pap test). You may be encouraged to have this screening done every 3 years, beginning at age 21.  For women ages 30-65, health care providers may recommend pelvic exams and Pap testing every 3 years, or they may recommend the Pap and pelvic exam, combined with testing for human papilloma virus (HPV), every 5 years. Some types of HPV increase your risk of cervical cancer. Testing for HPV may also be done on women of any age with unclear Pap test results.  Other health care providers may not recommend any screening for nonpregnant women who are considered low risk for pelvic cancer and who do not have symptoms. Ask your health care provider if a screening pelvic exam is right for you.  If you have had past treatment for cervical cancer or a condition that   could lead to cancer, you need Pap tests and screening for cancer for at least 20 years after your treatment. If Pap tests have been discontinued, your risk factors (such as having a new sexual partner) need to be reassessed to determine if screening should resume. Some women have medical problems that increase the chance of getting cervical cancer. In these cases, your health care provider may recommend more frequent screening and Pap tests. Colorectal Cancer  This type of cancer can be detected and  often prevented.  Routine colorectal cancer screening usually begins at 38 years of age and continues through 38 years of age.  Your health care provider may recommend screening at an earlier age if you have risk factors for colon cancer.  Your health care provider may also recommend using home test kits to check for hidden blood in the stool.  A small camera at the end of a tube can be used to examine your colon directly (sigmoidoscopy or colonoscopy). This is done to check for the earliest forms of colorectal cancer.  Routine screening usually begins at age 33.  Direct examination of the colon should be repeated every 5-10 years through 38 years of age. However, you may need to be screened more often if early forms of precancerous polyps or small growths are found. Skin Cancer  Check your skin from head to toe regularly.  Tell your health care provider about any new moles or changes in moles, especially if there is a change in a mole's shape or color.  Also tell your health care provider if you have a mole that is larger than the size of a pencil eraser.  Always use sunscreen. Apply sunscreen liberally and repeatedly throughout the day.  Protect yourself by wearing long sleeves, pants, a wide-brimmed hat, and sunglasses whenever you are outside. HEART DISEASE, DIABETES, AND HIGH BLOOD PRESSURE   High blood pressure causes heart disease and increases the risk of stroke. High blood pressure is more likely to develop in:  People who have blood pressure in the high end of the normal range (130-139/85-89 mm Hg).  People who are overweight or obese.  People who are African American.  If you are 47-70 years of age, have your blood pressure checked every 3-5 years. If you are 65 years of age or older, have your blood pressure checked every year. You should have your blood pressure measured twice--once when you are at a hospital or clinic, and once when you are not at a hospital or clinic.  Record the average of the two measurements. To check your blood pressure when you are not at a hospital or clinic, you can use:  An automated blood pressure machine at a pharmacy.  A home blood pressure monitor.  If you are between 15 years and 40 years old, ask your health care provider if you should take aspirin to prevent strokes.  Have regular diabetes screenings. This involves taking a blood sample to check your fasting blood sugar level.  If you are at a normal weight and have a low risk for diabetes, have this test once every three years after 38 years of age.  If you are overweight and have a high risk for diabetes, consider being tested at a younger age or more often. PREVENTING INFECTION  Hepatitis B  If you have a higher risk for hepatitis B, you should be screened for this virus. You are considered at high risk for hepatitis B if:  You were born in  a country where hepatitis B is common. Ask your health care provider which countries are considered high risk.  Your parents were born in a high-risk country, and you have not been immunized against hepatitis B (hepatitis B vaccine).  You have HIV or AIDS.  You use needles to inject street drugs.  You live with someone who has hepatitis B.  You have had sex with someone who has hepatitis B.  You get hemodialysis treatment.  You take certain medicines for conditions, including cancer, organ transplantation, and autoimmune conditions. Hepatitis C  Blood testing is recommended for:  Everyone born from 1945 through 1965.  Anyone with known risk factors for hepatitis C. Sexually transmitted infections (STIs)  You should be screened for sexually transmitted infections (STIs) including gonorrhea and chlamydia if:  You are sexually active and are younger than 38 years of age.  You are older than 38 years of age and your health care provider tells you that you are at risk for this type of infection.  Your sexual activity  has changed since you were last screened and you are at an increased risk for chlamydia or gonorrhea. Ask your health care provider if you are at risk.  If you do not have HIV, but are at risk, it may be recommended that you take a prescription medicine daily to prevent HIV infection. This is called pre-exposure prophylaxis (PrEP). You are considered at risk if:  You are sexually active and do not regularly use condoms or know the HIV status of your partner(s).  You take drugs by injection.  You are sexually active with a partner who has HIV. Talk with your health care provider about whether you are at high risk of being infected with HIV. If you choose to begin PrEP, you should first be tested for HIV. You should then be tested every 3 months for as long as you are taking PrEP.  PREGNANCY   If you are premenopausal and you may become pregnant, ask your health care provider about preconception counseling.  If you may become pregnant, take 400 to 800 micrograms (mcg) of folic acid every day.  If you want to prevent pregnancy, talk to your health care provider about birth control (contraception). OSTEOPOROSIS AND MENOPAUSE   Osteoporosis is a disease in which the bones lose minerals and strength with aging. This can result in serious bone fractures. Your risk for osteoporosis can be identified using a bone density scan.  If you are 65 years of age or older, or if you are at risk for osteoporosis and fractures, ask your health care provider if you should be screened.  Ask your health care provider whether you should take a calcium or vitamin D supplement to lower your risk for osteoporosis.  Menopause may have certain physical symptoms and risks.  Hormone replacement therapy may reduce some of these symptoms and risks. Talk to your health care provider about whether hormone replacement therapy is right for you.  HOME CARE INSTRUCTIONS   Schedule regular health, dental, and eye  exams.  Stay current with your immunizations.   Do not use any tobacco products including cigarettes, chewing tobacco, or electronic cigarettes.  If you are pregnant, do not drink alcohol.  If you are breastfeeding, limit how much and how often you drink alcohol.  Limit alcohol intake to no more than 1 drink per day for nonpregnant women. One drink equals 12 ounces of beer, 5 ounces of wine, or 1 ounces of hard liquor.    Do not use street drugs.  Do not share needles.  Ask your health care provider for help if you need support or information about quitting drugs.  Tell your health care provider if you often feel depressed.  Tell your health care provider if you have ever been abused or do not feel safe at home.   This information is not intended to replace advice given to you by your health care provider. Make sure you discuss any questions you have with your health care provider.   Document Released: 12/27/2010 Document Revised: 07/04/2014 Document Reviewed: 05/15/2013 [employer] Interactive Patient [employer].

## 2016-03-22 NOTE — Progress Notes (Signed)
Subjective:    Patient ID: Ellen [name], female    DOB: July 04, 1975, 40 y.o.   MRN: [id number]  HPI  Pt presents to the clinic today for her annual exam. She is also due for follow up of DM.  DM1, uncontrolled: Her last A1C was 10%, 08/2015. She is taking Humalog and Basaglar as prescribed. She is only checking her blood sugar when she feels like it is low. Her last eye exam was 09/2014. Flu shot 04/2013. Pneumovax never.  Flu: 04/2013 Pneumovax: never Tetanus: not sure Pap Smear: 02/2015, [facility] Vision Screening: yearly Dentist: yearly  Diet: She does eat meat. She  Consumes fruits and veggies daily. She does eat some fried foods. She drinks water, Dt. Mt Dew, Unsweetened tea. Exercise: She is riding her bike, 3 x week.   Review of Systems      Past Medical History:  Diagnosis Date  . DM (diabetes mellitus), type 1 ([place])   . Frequent headaches   . Hyperlipidemia     Current Outpatient Prescriptions  Medication Sig Dispense Refill  . glucagon (GLUCAGON EMERGENCY) 1 MG injection Inject 1 mg into the muscle once as needed. 2 each 12  . Insulin Glargine (BASAGLAR KWIKPEN) 100 UNIT/ML SOPN Inject 0.12 mLs (12 Units total) into the skin 2 (two) times daily. 15 mL 5  . insulin lispro (HUMALOG) 100 UNIT/ML KiwkPen Inject under skin up to 25 units daily as advised. 15 mL 5   No current facility-administered medications for this visit.     No Known Allergies  Family History  Problem Relation Age of Onset  . Hyperlipidemia Mother   . Cancer Father     prostate  . Arthritis Maternal Grandmother   . Hyperlipidemia Maternal Grandmother   . Hypertension Maternal Grandmother   . Stroke Maternal Grandfather   . Hypertension Maternal Grandfather   . Stroke Paternal Grandmother   . Diabetes Paternal Grandmother     Social History   Social History  . Marital status: Married    Spouse name: N/A  . Number of children: N/A  . Years of education: N/A   Occupational History   . Not on file.   Social History Main Topics  . Smoking status: Never Smoker  . Smokeless tobacco: Never Used  . Alcohol use No  . Drug use: No  . Sexual activity: Yes   Other Topics Concern  . Not on file   Social History Narrative  . No narrative on file     Constitutional: Denies fever, malaise, fatigue, headache or abrupt weight changes.  HEENT: Denies eye pain, eye redness, ear pain, ringing in the ears, wax buildup, runny nose, nasal congestion, bloody nose, or sore throat. Respiratory: Denies difficulty breathing, shortness of breath, cough or sputum production.   Cardiovascular: Denies chest pain, chest tightness, palpitations or swelling in the hands or feet.  Gastrointestinal: Denies abdominal pain, bloating, constipation, diarrhea or blood in the stool.  GU: Denies urgency, frequency, pain with urination, burning sensation, blood in urine, odor or discharge. Musculoskeletal: Denies decrease in range of motion, difficulty with gait, muscle pain or joint pain and swelling.  Skin: Pt reports enlarged lymph nodes. Denies redness, rashes, lesions or ulcercations.  Neurological: Denies dizziness, difficulty with memory, difficulty with speech or problems with balance and coordination.  Psych: Denies anxiety, depression, SI/HI.  No other specific complaints in a complete review of systems (except as listed in HPI above).  Objective:   Physical Exam   BP  106/70   Pulse 76   Temp 98.1 F (36.7 C) (Oral)   Ht '5\' 5"'$  (1.651 m)   Wt 170 lb 12 oz (77.5 kg)   LMP 03/09/2016   SpO2 99%   BMI 28.41 kg/m  Wt Readings from Last 3 Encounters:  03/22/16 170 lb 12 oz (77.5 kg)  02/05/16 170 lb (77.1 kg)  10/01/15 164 lb (74.4 kg)    General: Appears her stated age, well developed, well nourished in NAD. Skin: Warm, dry and intact. Small < 1 cm right occipital lymph node, mobile, slightly tender. Small < 1 cm left post auricular lymph node noted on the left.  HEENT: Head:  normal shape and size; Eyes: sclera white, no icterus, conjunctiva pink, PERRLA and EOMs intact; Ears: Tm's gray and intact, normal light reflex; Throat/Mouth: Teeth present, mucosa pink and moist, no exudate, lesions or ulcerations noted.  Neck:  Neck supple, trachea midline. No masses, lumps or thyromegaly present.  Cardiovascular: Normal rate and rhythm. S1,S2 noted.  No murmur, rubs or gallops noted. No JVD or BLE edema. . Pulmonary/Chest: Normal effort and positive vesicular breath sounds. No respiratory distress. No wheezes, rales or ronchi noted.  Abdomen: Soft and nontender. Normal bowel sounds. No distention or masses noted. Liver, spleen and kidneys non palpable. Musculoskeletal: Normal range of motion. No signs of joint swelling. No difficulty with gait.  Neurological: Alert and oriented. Cranial nerves II-XII grossly intact. Coordination normal.  Psychiatric: Mood and affect normal. Behavior is normal. Judgment and thought content normal.     BMET    Component Value Date/Time   NA 136 10/17/2014 1048   NA 140 03/21/2013   K 4.0 10/17/2014 1048   CL 102 10/17/2014 1048   CO2 27 10/17/2014 1048   GLUCOSE 210 (H) 10/17/2014 1048   BUN 15 10/17/2014 1048   BUN 14 03/21/2013   CREATININE 0.68 10/17/2014 1048   CALCIUM 9.2 10/17/2014 1048   GFRNONAA >60 10/18/2007 1053   GFRAA  10/18/2007 1053    >60        The eGFR has been calculated using the MDRD equation. This calculation has not been validated in all clinical    Lipid Panel     Component Value Date/Time   CHOL 175 08/27/2015 1554   TRIG 54.0 08/27/2015 1554   HDL 72.40 08/27/2015 1554   CHOLHDL 2 08/27/2015 1554   VLDL 10.8 08/27/2015 1554   LDLCALC 92 08/27/2015 1554    CBC    Component Value Date/Time   WBC 7.1 03/18/2015 1434   RBC 4.58 03/18/2015 1434   HGB 12.9 03/18/2015 1434   HCT 38.8 03/18/2015 1434   PLT 314.0 03/18/2015 1434   MCV 84.8 03/18/2015 1434   MCHC 33.3 03/18/2015 1434   RDW 13.4  03/18/2015 1434   LYMPHSABS 1.4 03/18/2015 1434   MONOABS 0.4 03/18/2015 1434   EOSABS 0.1 03/18/2015 1434   BASOSABS 0.1 03/18/2015 1434    Hgb A1C Lab Results  Component Value Date   HGBA1C 10.0 (H) 08/27/2015       Assessment & Plan:   Preventative Health Maintenance:  She declines flu, tetanus or pneumovax Pap smear due 2019 Discussed baseline mammogram at age 43 Encouraged her to consume a balanced diet and exercise regimen Advised her to see an eye doctor and dentist annually Will check CBC, CMET, Lipid profile today  RTC in 6 months to follow up DM 1 Marisa Hufstetler, NP

## 2016-03-22 NOTE — Addendum Note (Signed)
Addended by: Baldomero LamyHAVERS, Manisha Cancel C on: 03/22/2016 09:31 AM   Modules accepted: Orders

## 2016-03-22 NOTE — Assessment & Plan Note (Signed)
A1C not at goal Discussed the importance of monitoring sugars Encouraged her to consume a low fat, low carb diet A1C and microalbumin today Continue Humalog and Basaglar Advised her to get a yearly eye exam She declines flu, pneumovax and tetanus today

## 2016-06-29 ENCOUNTER — Encounter: Payer: Self-pay | Admitting: Internal Medicine

## 2016-06-29 ENCOUNTER — Telehealth: Payer: Self-pay | Admitting: Internal Medicine

## 2016-06-29 MED ORDER — OSELTAMIVIR PHOSPHATE 75 MG PO CAPS: 75.0000 mg | ORAL_CAPSULE | Freq: Every day | ORAL | 0 refills | Status: DC

## 2016-06-29 NOTE — Telephone Encounter (Signed)
Pt called she just left dr office with her child who test postive for flu a.  Can you call her in something (prolaxis) preventative.  [place]   Please advise

## 2016-06-29 NOTE — Addendum Note (Signed)
Addended by: Lorre MunroeBAITY, Brylin Stopper W on: 06/29/2016 12:01 PM   Modules accepted: Orders

## 2016-06-29 NOTE — Telephone Encounter (Signed)
Tamiflu sent to pharmacy

## 2016-08-31 LAB — HM DIABETES EYE EXAM

## 2016-09-19 ENCOUNTER — Encounter: Payer: Self-pay | Admitting: Internal Medicine

## 2016-09-19 ENCOUNTER — Ambulatory Visit (INDEPENDENT_AMBULATORY_CARE_PROVIDER_SITE_OTHER): Payer: 59 | Admitting: Internal Medicine

## 2016-09-19 VITALS — BP 106/68 | HR 78 | Temp 98.2°F | Wt 175.0 lb

## 2016-09-19 DIAGNOSIS — E1065 Type 1 diabetes mellitus with hyperglycemia: Secondary | ICD-10-CM | POA: Diagnosis not present

## 2016-09-19 LAB — HEMOGLOBIN A1C: Hgb A1c MFr Bld: 9.9 % — ABNORMAL HIGH (ref 4.6–6.5)

## 2016-09-19 NOTE — Patient Instructions (Signed)
Hyperglycemia Hyperglycemia is when the sugar (glucose) level in your blood is too high. It may not cause symptoms. If you do have symptoms, they may include warning signs, such as:  Feeling more thirsty than normal.  Hunger.  Feeling tired.  Needing to pee (urinate) more than normal.  Blurry eyesight (vision). You may get other symptoms as it gets worse, such as:  Dry mouth.  Not being hungry (loss of appetite).  Fruity-smelling breath.  Weakness.  Weight gain or loss that is not planned. Weight loss may be fast.  A tingling or numb feeling in your hands or feet.  Headache.  Skin that does not bounce back quickly when it is lightly pinched and released (poor skin turgor).  Pain in your belly (abdomen).  Cuts or bruises that heal slowly. High blood sugar can happen to people who do or do not have diabetes. High blood sugar can happen slowly or quickly, and it can be an emergency. Follow these instructions at home: General instructions  Take over-the-counter and prescription medicines only as told by your doctor.  Do not use products that contain nicotine or tobacco, such as cigarettes and e-cigarettes. If you need help quitting, ask your doctor.  Limit alcohol intake to no more than 1 drink per day for nonpregnant women and 2 drinks per day for men. One drink equals 12 oz of beer, 5 oz of wine, or 1 oz of hard liquor.  Manage stress. If you need help with this, ask your doctor.  Keep all follow-up visits as told by your doctor. This is important. Eating and drinking  Stay at a healthy weight.  Exercise regularly, as told by your doctor.  Drink enough fluid, especially when you:  Exercise.  Get sick.  Are in hot temperatures.  Eat healthy foods, such as:  Low-fat (lean) proteins.  Complex carbs (complex carbohydrates), such as whole wheat bread or brown rice.  Fresh fruits and vegetables.  Low-fat dairy products.  Healthy fats.  Drink enough  fluid to keep your pee (urine) clear or pale yellow. If you have diabetes:  Make sure you know the symptoms of hyperglycemia.  Follow your diabetes management plan, as told by your doctor. Make sure you:  Take insulin and medicines as told.  Follow your exercise plan.  Follow your meal plan. Eat on time. Do not skip meals.  Check your blood sugar as often as told. Make sure to check before and after exercise. If you exercise longer or in a different way than you normally do, check your blood sugar more often.  Follow your sick day plan whenever you cannot eat or drink normally. Make this plan ahead of time with your doctor.  Share your diabetes management plan with people in your workplace, school, and household.  Check your urine for ketones when you are ill and as told by your doctor.  Carry a card or wear jewelry that says that you have diabetes. Contact a doctor if:  Your blood sugar level is higher than 240 mg/dL (13.3 mmol/L) for 2 days in a row.  You have problems keeping your blood sugar in your target range.  High blood sugar happens often for you. Get help right away if:  You have trouble breathing.  You have a change in how you think, feel, or act (mental status).  You feel sick to your stomach (nauseous), and that feeling does not go away.  You cannot stop throwing up (vomiting). These symptoms may be an   emergency. Do not wait to see if the symptoms will go away. Get medical help right away. Call your local emergency services (911 in the [place] Do not drive yourself to the hospital.  Summary  Hyperglycemia is when the sugar (glucose) level in your blood is too high.  High blood sugar can happen to people who do or do not have diabetes.  Make sure you drink enough fluids, eat healthy foods, and exercise regularly.  Contact your doctor if you have problems keeping your blood sugar in your target range. This information is not intended to replace advice given  to you by your health care provider. Make sure you discuss any questions you have with your health care provider. Document Released: 04/10/2009 Document Revised: 02/29/2016 Document Reviewed: 02/29/2016 Elsevier Interactive Patient Education  2017 [employer] Inc.  

## 2016-09-19 NOTE — Assessment & Plan Note (Addendum)
Uncontrolled She is working hard on diet and exercise for weight and sugar control A1C today Encouraged her to consume a low carb, low sugar diet Advised her to eat before exercising Continue yearly eye exams Foot exam today She declines flu or pneumovax today Continue Basaglar and Humalog

## 2016-09-19 NOTE — Progress Notes (Signed)
Subjective:    Patient ID: Ellen [name], female    DOB: 01-Mar-1976, 41 y.o.   MRN: [id number]  HPI  Pt presents to the clinic today for follow up of DM1. Her last A1C was 9.4%, 02/2016. Her sugars range 50-521. If she has lows, they occur in the middle of the night or while exercising. She is taking Basiglar and Humalog as prescribed. Her last eye exam was 08/2016. She denies blurred vision, increased thirst, urinary frequency, nonhealing wounds or numbness/tingling in her feet.  Review of Systems      Past Medical History:  Diagnosis Date  . DM (diabetes mellitus), type 1 ([place])   . Frequent headaches   . Hyperlipidemia     Current Outpatient Prescriptions  Medication Sig Dispense Refill  . glucagon (GLUCAGON EMERGENCY) 1 MG injection Inject 1 mg into the muscle once as needed. 2 each 12  . Insulin Glargine (BASAGLAR KWIKPEN) 100 UNIT/ML SOPN Inject 0.12 mLs (12 Units total) into the skin 2 (two) times daily. 15 mL 5  . insulin lispro (HUMALOG) 100 UNIT/ML KiwkPen Inject under skin up to 25 units daily as advised. 15 mL 5  . oseltamivir (TAMIFLU) 75 MG capsule Take 1 capsule (75 mg total) by mouth daily. 10 capsule 0   No current facility-administered medications for this visit.     No Known Allergies  Family History  Problem Relation Age of Onset  . Hyperlipidemia Mother   . Cancer Father     prostate  . Arthritis Maternal Grandmother   . Hyperlipidemia Maternal Grandmother   . Hypertension Maternal Grandmother   . Stroke Maternal Grandfather   . Hypertension Maternal Grandfather   . Stroke Paternal Grandmother   . Diabetes Paternal Grandmother     Social History   Social History  . Marital status: Married    Spouse name: N/A  . Number of children: N/A  . Years of education: N/A   Occupational History  . Not on file.   Social History Main Topics  . Smoking status: Never Smoker  . Smokeless tobacco: Never Used  . Alcohol use No  . Drug use: No  . Sexual  activity: Yes   Other Topics Concern  . Not on file   Social History Narrative  . No narrative on file     Constitutional: Denies fever, malaise, fatigue, headache or abrupt weight changes.  Respiratory: Denies difficulty breathing, shortness of breath, cough or sputum production.   Cardiovascular: Denies chest pain, chest tightness, palpitations or swelling in the hands or feet.  Gastrointestinal: Denies abdominal pain, bloating, constipation, diarrhea or blood in the stool.  GU: Denies urgency, frequency, pain with urination, burning sensation, blood in urine, odor or discharge. Skin: Denies redness, rashes, lesions or ulcercations.   No other specific complaints in a complete review of systems (except as listed in HPI above).  Objective:   Physical Exam BP 106/68   Pulse 78   Temp 98.2 F (36.8 C) (Oral)   Wt 175 lb (79.4 kg)   LMP 09/17/2016   SpO2 99%   BMI 29.12 kg/m   Wt Readings from Last 3 Encounters:  09/19/16 175 lb (79.4 kg)  03/22/16 170 lb 12 oz (77.5 kg)  02/05/16 170 lb (77.1 kg)    General: Appears her stated age, well developed, well nourished in NAD. Cardiovascular: Normal rate and rhythm.  Pulmonary/Chest: Normal effort and positive vesicular breath sounds. No respiratory distress. No wheezes, rales or ronchi noted.  Neurological: Alert  and oriented.    BMET    Component Value Date/Time   NA 138 03/22/2016 0918   NA 140 03/21/2013   K 4.5 03/22/2016 0918   CL 102 03/22/2016 0918   CO2 31 03/22/2016 0918   GLUCOSE 189 (H) 03/22/2016 0918   BUN 14 03/22/2016 0918   BUN 14 03/21/2013   CREATININE 0.68 03/22/2016 0918   CALCIUM 9.0 03/22/2016 0918   GFRNONAA >60 10/18/2007 1053   GFRAA  10/18/2007 1053    >60        The eGFR has been calculated using the MDRD equation. This calculation has not been validated in all clinical    Lipid Panel     Component Value Date/Time   CHOL 161 03/22/2016 0918   TRIG 60.0 03/22/2016 0918   HDL  68.90 03/22/2016 0918   CHOLHDL 2 03/22/2016 0918   VLDL 12.0 03/22/2016 0918   LDLCALC 80 03/22/2016 0918    CBC    Component Value Date/Time   WBC 6.6 03/22/2016 0918   RBC 4.59 03/22/2016 0918   HGB 12.9 03/22/2016 0918   HCT 38.6 03/22/2016 0918   PLT 315.0 03/22/2016 0918   MCV 84.2 03/22/2016 0918   MCHC 33.5 03/22/2016 0918   RDW 13.4 03/22/2016 0918   LYMPHSABS 1.4 03/18/2015 1434   MONOABS 0.4 03/18/2015 1434   EOSABS 0.1 03/18/2015 1434   BASOSABS 0.1 03/18/2015 1434    Hgb A1C Lab Results  Component Value Date   HGBA1C 9.4 (H) 03/22/2016             Assessment & Plan:

## 2016-11-18 ENCOUNTER — Other Ambulatory Visit: Payer: Self-pay | Admitting: Internal Medicine

## 2016-11-18 DIAGNOSIS — E1065 Type 1 diabetes mellitus with hyperglycemia: Secondary | ICD-10-CM

## 2016-12-30 ENCOUNTER — Other Ambulatory Visit: Payer: Self-pay | Admitting: Internal Medicine

## 2017-04-06 ENCOUNTER — Ambulatory Visit: Payer: 59 | Admitting: Internal Medicine

## 2017-04-06 ENCOUNTER — Encounter: Payer: Self-pay | Admitting: Primary Care

## 2017-04-06 ENCOUNTER — Ambulatory Visit (INDEPENDENT_AMBULATORY_CARE_PROVIDER_SITE_OTHER): Payer: 59 | Admitting: Primary Care

## 2017-04-06 VITALS — BP 110/70 | HR 69 | Temp 98.8°F | Ht 65.0 in | Wt 177.1 lb

## 2017-04-06 DIAGNOSIS — R8290 Unspecified abnormal findings in urine: Secondary | ICD-10-CM

## 2017-04-06 LAB — POC URINALSYSI DIPSTICK (AUTOMATED)
Bilirubin, UA: NEGATIVE
Ketones, UA: NEGATIVE
Leukocytes, UA: NEGATIVE
Nitrite, UA: NEGATIVE
PH UA: 6 (ref 5.0–8.0)
PROTEIN UA: NEGATIVE
RBC UA: NEGATIVE
SPEC GRAV UA: 1.01 (ref 1.010–1.025)
UROBILINOGEN UA: 0.2 U/dL

## 2017-04-06 MED ORDER — CIPROFLOXACIN HCL 500 MG PO TABS: 500.0000 mg | ORAL_TABLET | Freq: Two times a day (BID) | ORAL | 0 refills | Status: DC

## 2017-04-06 NOTE — Progress Notes (Signed)
Subjective:    Patient ID: Ellen [name], female    DOB: 05-Jun-1976, 41 y.o.   MRN: [id number]  HPI  Ellen [name] is a 41 year old female with a history of type 1 diabetes and UTI who presents today with a chief complaint of foul smelling urine. She also reports urinary frequency with urgency. Her symptoms began yesterday. She denies hematuria, dysuria, vaginal symptoms, abdominal pain.   Her blood sugars were running in the 300's 2-3 weeks ago due to having a bad insulin pen, since she's switched out her pen her sugars have improved. She will get UTI's with a combination of hyperglycemia and intercourse which has been the case recently.    Review of Systems  Constitutional: Negative for fever.  Gastrointestinal: Negative for abdominal pain and nausea.  Genitourinary: Positive for frequency and urgency. Negative for dysuria, flank pain, hematuria and vaginal discharge.       Past Medical History:  Diagnosis Date  . DM (diabetes mellitus), type 1 ([facility])   . Frequent headaches   . Hyperlipidemia      Social History   Social History  . Marital status: Married    Spouse name: N/A  . Number of children: N/A  . Years of education: N/A   Occupational History  . Not on file.   Social History Main Topics  . Smoking status: Never Smoker  . Smokeless tobacco: Never Used  . Alcohol use No  . Drug use: No  . Sexual activity: Yes   Other Topics Concern  . Not on file   Social History Narrative  . No narrative on file    Past Surgical History:  Procedure Laterality Date  . CESAREAN SECTION  7/04,9/07.4/09    Family History  Problem Relation Age of Onset  . Hyperlipidemia Mother   . Cancer Father        prostate  . Arthritis Maternal Grandmother   . Hyperlipidemia Maternal Grandmother   . Hypertension Maternal Grandmother   . Stroke Maternal Grandfather   . Hypertension Maternal Grandfather   . Stroke Paternal Grandmother   . Diabetes Paternal Grandmother     No  Known Allergies  Current Outpatient Prescriptions on File Prior to Visit  Medication Sig Dispense Refill  . glucagon (GLUCAGON EMERGENCY) 1 MG injection Inject 1 mg into the muscle once as needed. 2 each 12  . HUMALOG KWIKPEN 100 UNIT/ML KiwkPen INJECT UP TO 25 UNITS UNDER THE SKIN DAILY AS DIRECTED. 15 mL 4  . Insulin Glargine (BASAGLAR KWIKPEN) 100 UNIT/ML SOPN INJECT 0.12 MLS (12 UNITS TOTAL)INTO THESKIN 2 (TWO) TIMES DAILY 15 mL 0   No current facility-administered medications on file prior to visit.     BP 110/70   Pulse 69   Temp 98.8 F (37.1 C) (Oral)   Ht  (1.651 m)   Wt 177 lb 1.9 oz (80.3 kg)   LMP 03/24/2017   SpO2 99%   BMI 29.47 kg/m    Objective:   Physical Exam  Constitutional: She appears well-nourished.  Neck: Neck supple.  Cardiovascular: Normal rate and regular rhythm.   Pulmonary/Chest: Effort normal and breath sounds normal.  Abdominal: Soft. There is no tenderness. There is no CVA tenderness.  Skin: Skin is warm and dry.          Assessment & Plan:  Foul Smelling Urine:  Also with urinary frequency and urgency. Urine smelled very foul with sample today. Negative leuks, nitrites, blood. 2+ glucose. Culture sent. Given  history in combination with foul smell today will treat. Rx for Cipro course sent to pharmacy.  Push fluids. Will stop Cipro if culture negative.  Morrie Sheldon, NP

## 2017-04-06 NOTE — Patient Instructions (Signed)
Start ciprofloxacin antibiotics. Take 1 tablet by mouth twice daily for 5 days.  Ensure you are staying hydrated with water. I'll be in touch in a few days regarding your urine culture.  It was a pleasure meeting you!

## 2017-04-06 NOTE — Addendum Note (Signed)
Addended by: Tawnya Crook on: 04/06/2017 12:38 PM   Modules accepted: Orders

## 2017-04-09 LAB — URINE CULTURE
MICRO NUMBER: 81134992
SPECIMEN QUALITY: ADEQUATE

## 2017-04-26 ENCOUNTER — Ambulatory Visit: Payer: 59 | Admitting: Internal Medicine

## 2017-06-09 ENCOUNTER — Ambulatory Visit: Payer: 59 | Admitting: Family Medicine

## 2017-06-09 ENCOUNTER — Encounter: Payer: Self-pay | Admitting: Family Medicine

## 2017-06-09 VITALS — BP 118/62 | HR 77 | Temp 98.3°F | Wt 177.0 lb

## 2017-06-09 DIAGNOSIS — J0120 Acute ethmoidal sinusitis, unspecified: Secondary | ICD-10-CM

## 2017-06-09 DIAGNOSIS — J019 Acute sinusitis, unspecified: Secondary | ICD-10-CM | POA: Insufficient documentation

## 2017-06-09 MED ORDER — DOXYCYCLINE HYCLATE 100 MG PO TABS: 100.0000 mg | ORAL_TABLET | Freq: Two times a day (BID) | ORAL | 0 refills | Status: DC

## 2017-06-09 NOTE — Patient Instructions (Addendum)
You have a sinus infection. If viral it should improve over 7-10 days and antibiotics will not help.  Take medicine as prescribed: doxycycline antibiotic to cover bacterial sinus infection.  Push fluids and plenty of rest. Nasal saline irrigation or neti pot to help drain sinuses. Ibuprofen 400-600mg  with meals for sinus inflammation.  May use plain mucinex with plenty of fluid to help mobilize mucous. Please let us know if fever >101.5, trouble opening/closing mouth, difficulty swallowing, or worsening instead of improving as expected.

## 2017-06-09 NOTE — Assessment & Plan Note (Signed)
Anticipate acute sinusitis. Given ongoing cough and comorbidities, will treat aggressively with doxycycline antibiotic. Pt declines cheratussin. Advised she buy robitussin or delsym to take for upcoming trip.  Supportive care as per instructions.  Advised against taking left over abx at home due to increased development of antibiotic resistance

## 2017-06-09 NOTE — Progress Notes (Signed)
BP 118/62 (BP Location: Left Arm, Patient Position: Sitting, Cuff Size: Normal)   Pulse 77   Temp 98.3 F (36.8 C) (Oral)   Wt 177 lb (80.3 kg)   LMP 05/18/2017   SpO2 99%   BMI 29.45 kg/m    CC: cough, sinus pressure Subjective:    Patient ID: Ellen [name], female    DOB: 05/12/1976, 41 y.o.   MRN: [id number]  HPI: Ellen [name] is a [age] y.o. female presenting on 06/09/2017 for Cough (causing sore throat. Cough is worse at night. Has had for a couple of weeks. ) and Sinus Problem (pressure in face and a lot of drainage. Sxs started 06/05/17. Pt states she is leaving to go out of town in a few hrs )   2 wk h/o night time coughing fits, with 5d h/o nasal congestion, pressure, drainage. Today started with sinus pressure and swollen glands with neck pain. Fatigue. Some earache that has since resolved. Head > chest congestion.   No fevers/chills, tooth pain, dyspnea or wheezing.   Taking zyrtec daily. Took some left over amoxicillin 500mg  bid over the last 5 days.   She had similar illness 1 month ago that never fully resolved.  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  No smokers at home. No h/o asthma.  She is a type 1 diabetic.   Upcoming weekend trip to [place] (driving overnight on holiday bus).   Relevant past medical, surgical, family and social history reviewed and updated as indicated. Interim medical history since our last visit reviewed. Allergies and medications reviewed and updated. Outpatient Medications Prior to Visit  Medication Sig Dispense Refill  . glucagon (GLUCAGON EMERGENCY) 1 MG injection Inject 1 mg into the muscle once as needed. 2 each 12  . HUMALOG KWIKPEN 100 UNIT/ML KiwkPen INJECT UP TO 25 UNITS UNDER THE SKIN DAILY AS DIRECTED. 15 mL 4  . Insulin Glargine (BASAGLAR KWIKPEN) 100 UNIT/ML SOPN INJECT 0.12 MLS (12 UNITS TOTAL)INTO THESKIN 2 (TWO) TIMES DAILY 15 mL 0  . ciprofloxacin (CIPRO) 500 MG tablet Take 1 tablet (500 mg total) by mouth 2 (two) times daily.  10 tablet 0   No facility-administered medications prior to visit.      Per HPI unless specifically indicated in ROS section below Review of Systems     Objective:    BP 118/62 (BP Location: Left Arm, Patient Position: Sitting, Cuff Size: Normal)   Pulse 77   Temp 98.3 F (36.8 C) (Oral)   Wt 177 lb (80.3 kg)   LMP 05/18/2017   SpO2 99%   BMI 29.45 kg/m   Wt Readings from Last 3 Encounters:  06/09/17 177 lb (80.3 kg)  04/06/17 177 lb 1.9 oz (80.3 kg)  09/19/16 175 lb (79.4 kg)    Physical Exam  Constitutional: She appears well-developed and well-nourished. No distress.  HENT:  Head: Normocephalic and atraumatic.  Right Ear: Hearing, tympanic membrane, external ear and ear canal normal.  Left Ear: Hearing, tympanic membrane, external ear and ear canal normal.  Nose: Mucosal edema (congested nasal mucosa) present. No rhinorrhea. Right sinus exhibits no maxillary sinus tenderness and no frontal sinus tenderness. Left sinus exhibits no maxillary sinus tenderness and no frontal sinus tenderness.  Mouth/Throat: Uvula is midline, oropharynx is clear and moist and mucous membranes are normal. No oropharyngeal exudate, posterior oropharyngeal edema, posterior oropharyngeal erythema or tonsillar abscesses.  Ethmoid sinus tenderness to palpation  Eyes: Conjunctivae and EOM are normal. Pupils are equal, round, and reactive  to light. No scleral icterus.  Neck: Normal range of motion. Neck supple.  Cardiovascular: Normal rate, regular rhythm, normal heart sounds and intact distal pulses.  No murmur heard. Pulmonary/Chest: Effort normal and breath sounds normal. No respiratory distress. She has no wheezes. She has no rales.  Lungs clear  Lymphadenopathy:    She has no cervical adenopathy.  Skin: Skin is warm and dry. No rash noted.  Nursing note and vitals reviewed.     Assessment & Plan:   Problem List Items Addressed This Visit    Acute sinusitis - Primary    Anticipate acute  sinusitis. Given ongoing cough and comorbidities, will treat aggressively with doxycycline antibiotic. Pt declines cheratussin. Advised she buy robitussin or delsym to take for upcoming trip.  Supportive care as per instructions.  Advised against taking left over abx at home due to increased development of antibiotic resistance      Relevant Medications   doxycycline (VIBRA-TABS) 100 MG tablet       Follow up plan: No Follow-up on file.  Eustaquio BoydenJavier Teshawn Moan, MD

## 2017-07-11 ENCOUNTER — Ambulatory Visit: Payer: 59 | Admitting: Internal Medicine

## 2017-07-11 ENCOUNTER — Encounter: Payer: Self-pay | Admitting: Internal Medicine

## 2017-07-11 ENCOUNTER — Ambulatory Visit: Payer: 59

## 2017-07-11 VITALS — BP 114/70 | HR 87 | Temp 97.9°F | Wt 176.0 lb

## 2017-07-11 DIAGNOSIS — R3915 Urgency of urination: Secondary | ICD-10-CM | POA: Diagnosis not present

## 2017-07-11 DIAGNOSIS — E1065 Type 1 diabetes mellitus with hyperglycemia: Secondary | ICD-10-CM | POA: Diagnosis not present

## 2017-07-11 DIAGNOSIS — R8290 Unspecified abnormal findings in urine: Secondary | ICD-10-CM | POA: Diagnosis not present

## 2017-07-11 DIAGNOSIS — Z111 Encounter for screening for respiratory tuberculosis: Secondary | ICD-10-CM

## 2017-07-11 LAB — POC URINALSYSI DIPSTICK (AUTOMATED)
BILIRUBIN UA: NEGATIVE
Ketones, UA: NEGATIVE
Leukocytes, UA: NEGATIVE
Nitrite, UA: NEGATIVE
PH UA: 6 (ref 5.0–8.0)
Protein, UA: NEGATIVE
RBC UA: NEGATIVE
SPEC GRAV UA: 1.025 (ref 1.010–1.025)
Urobilinogen, UA: 0.2 E.U./dL

## 2017-07-11 LAB — HEMOGLOBIN A1C: HEMOGLOBIN A1C: 10 % — AB (ref 4.6–6.5)

## 2017-07-11 MED ORDER — NITROFURANTOIN MONOHYD MACRO 100 MG PO CAPS: 100.0000 mg | ORAL_CAPSULE | Freq: Two times a day (BID) | ORAL | 0 refills | Status: DC

## 2017-07-11 NOTE — Addendum Note (Signed)
Addended by: Roena MaladyEVONTENNO, Shamecca Whitebread Y on: 07/11/2017 08:44 AM   Modules accepted: Orders

## 2017-07-11 NOTE — Progress Notes (Signed)
Subjective:    Patient ID: Ellen [name], female    DOB: 07-Sep-1975, 42 y.o.   MRN: [id number]  HPI  Pt presents to the clinic today with c/o urinary urgency and foul smelling urine. This started 1 week ago. She denies frequency, dysuria or blood in her urine. She denies fever, chills, nausea or low back pain. She has not taken anything OTC for her symptoms. Her last UTI was 03/2017. Her urine culture grew out [employer]. She was treated with a 5 day course of Cipro.   Additionally, she is due to follow up on DM 1. She reports her sugars are all over the place. She denies profound hypoglycemia. She checks her feet daily. She denies ulcerations or non healing wounds. She denies numbness or tingling in her feet. She has had her eyes examined in the last year. She does not take flu or pneumonia vaccines.  She would also like a TB test today.  Review of Systems      Past Medical History:  Diagnosis Date  . DM (diabetes mellitus), type 1 ([place])   . Frequent headaches   . Hyperlipidemia     Current Outpatient Medications  Medication Sig Dispense Refill  . glucagon (GLUCAGON EMERGENCY) 1 MG injection Inject 1 mg into the muscle once as needed. 2 each 12  . HUMALOG KWIKPEN 100 UNIT/ML KiwkPen INJECT UP TO 25 UNITS UNDER THE SKIN DAILY AS DIRECTED. 15 mL 4  . Insulin Glargine (BASAGLAR KWIKPEN) 100 UNIT/ML SOPN INJECT 0.12 MLS (12 UNITS TOTAL)INTO THESKIN 2 (TWO) TIMES DAILY 15 mL 0   No current facility-administered medications for this visit.     No Known Allergies  Family History  Problem Relation Age of Onset  . Hyperlipidemia Mother   . Cancer Father        prostate  . Arthritis Maternal Grandmother   . Hyperlipidemia Maternal Grandmother   . Hypertension Maternal Grandmother   . Stroke Maternal Grandfather   . Hypertension Maternal Grandfather   . Stroke Paternal Grandmother   . Diabetes Paternal Grandmother     Social History   Socioeconomic History  . Marital status:  Married    Spouse name: Not on file  . Number of children: Not on file  . Years of education: Not on file  . Highest education level: Not on file  Social Needs  . Financial resource strain: Not on file  . Food insecurity - worry: Not on file  . Food insecurity - inability: Not on file  . Transportation needs - medical: Not on file  . Transportation needs - non-medical: Not on file  Occupational History  . Not on file  Tobacco Use  . Smoking status: Never Smoker  . Smokeless tobacco: Never Used  Substance and Sexual Activity  . Alcohol use: No  . Drug use: No  . Sexual activity: Yes  Other Topics Concern  . Not on file  Social History Narrative  . Not on file     Constitutional: Denies fever, malaise, fatigue, headache or abrupt weight changes.  Skin: Denies rash, redness, lesion or ulceration. Gastrointestinal: Denies abdominal pain, bloating, constipation, diarrhea or blood in the stool.  GU: Pt reports urinary urgency and foul smelling urine. Denies  frequency, pain with urination, burning sensation, blood in urine,  or discharge.   No other specific complaints in a complete review of systems (except as listed in HPI above).  Objective:   Physical Exam   BP 114/70   Pulse  87   Temp 97.9 F (36.6 C) (Oral)   Wt 176 lb (79.8 kg)   LMP 06/18/2017   SpO2 99%   BMI 29.29 kg/m  Wt Readings from Last 3 Encounters:  07/11/17 176 lb (79.8 kg)  06/09/17 177 lb (80.3 kg)  04/06/17 177 lb 1.9 oz (80.3 kg)    General: Appears her stated age, in NAD. Skin: No ulcerations noted. Abdomen: Soft and nontender. Normal bowel sounds. No distention or masses noted. No CVA tenderness noted.  BMET    Component Value Date/Time   NA 138 03/22/2016 0918   NA 140 03/21/2013   K 4.5 03/22/2016 0918   CL 102 03/22/2016 0918   CO2 31 03/22/2016 0918   GLUCOSE 189 (H) 03/22/2016 0918   BUN 14 03/22/2016 0918   BUN 14 03/21/2013   CREATININE 0.68 03/22/2016 0918   CALCIUM 9.0  03/22/2016 0918   GFRNONAA >60 10/18/2007 1053   GFRAA  10/18/2007 1053    >60        The eGFR has been calculated using the MDRD equation. This calculation has not been validated in all clinical    Lipid Panel     Component Value Date/Time   CHOL 161 03/22/2016 0918   TRIG 60.0 03/22/2016 0918   HDL 68.90 03/22/2016 0918   CHOLHDL 2 03/22/2016 0918   VLDL 12.0 03/22/2016 0918   LDLCALC 80 03/22/2016 0918    CBC    Component Value Date/Time   WBC 6.6 03/22/2016 0918   RBC 4.59 03/22/2016 0918   HGB 12.9 03/22/2016 0918   HCT 38.6 03/22/2016 0918   PLT 315.0 03/22/2016 0918   MCV 84.2 03/22/2016 0918   MCHC 33.5 03/22/2016 0918   RDW 13.4 03/22/2016 0918   LYMPHSABS 1.4 03/18/2015 1434   MONOABS 0.4 03/18/2015 1434   EOSABS 0.1 03/18/2015 1434   BASOSABS 0.1 03/18/2015 1434    Hgb A1C Lab Results  Component Value Date   HGBA1C 9.9 (H) 09/19/2016           Assessment & Plan:   DM 1:  Repeat A1C today Continue Basaglar and Humalog as prescribed, will adjust if needed based on labs Foot exam today Encouraged yearly eye exams She declines flu or pneumonia vaccine  Urinary Urgency, Foul Smelling Urine:  Urinalysis: 3+ glucose Will send urine culture eRx for Macrobid 100 mg BID x 5 days Push fluids Can take AZO OTC  Need for TB Test:  TB skin test administered today  RTC in 2 days to have your TB test read, return precautions discussed Webb Silversmith, NP

## 2017-07-11 NOTE — Patient Instructions (Signed)

## 2017-07-12 LAB — URINE CULTURE
MICRO NUMBER: 90060405
RESULT: NO GROWTH
SPECIMEN QUALITY: ADEQUATE

## 2017-07-13 LAB — TB SKIN TEST: TB SKIN TEST: NEGATIVE

## 2017-08-27 DIAGNOSIS — J029 Acute pharyngitis, unspecified: Secondary | ICD-10-CM | POA: Diagnosis not present

## 2017-10-29 ENCOUNTER — Encounter: Payer: Self-pay | Admitting: Internal Medicine

## 2018-05-11 DIAGNOSIS — Z794 Long term (current) use of insulin: Secondary | ICD-10-CM | POA: Diagnosis not present

## 2018-05-11 DIAGNOSIS — H5213 Myopia, bilateral: Secondary | ICD-10-CM | POA: Diagnosis not present

## 2018-05-11 DIAGNOSIS — E1065 Type 1 diabetes mellitus with hyperglycemia: Secondary | ICD-10-CM | POA: Diagnosis not present

## 2018-05-11 LAB — HM DIABETES EYE EXAM

## 2018-06-25 ENCOUNTER — Ambulatory Visit: Payer: 59 | Admitting: Internal Medicine

## 2018-06-25 ENCOUNTER — Encounter: Payer: Self-pay | Admitting: Internal Medicine

## 2018-06-25 VITALS — BP 116/80 | HR 80 | Temp 98.2°F | Wt 183.0 lb

## 2018-06-25 DIAGNOSIS — E1065 Type 1 diabetes mellitus with hyperglycemia: Secondary | ICD-10-CM

## 2018-06-25 DIAGNOSIS — E109 Type 1 diabetes mellitus without complications: Secondary | ICD-10-CM | POA: Diagnosis not present

## 2018-06-25 LAB — COMPREHENSIVE METABOLIC PANEL
ALK PHOS: 73 U/L (ref 39–117)
ALT: 12 U/L (ref 0–35)
AST: 11 U/L (ref 0–37)
Albumin: 0.1 g/dL — ABNORMAL LOW (ref 3.5–5.2)
BUN: 13 mg/dL (ref 6–23)
CALCIUM: 8.6 mg/dL (ref 8.4–10.5)
CO2: 26 mEq/L (ref 19–32)
Chloride: 103 mEq/L (ref 96–112)
Creatinine, Ser: 0.62 mg/dL (ref 0.40–1.20)
GFR: 112.8 mL/min (ref >60.00)
Glucose, Bld: 120 mg/dL — ABNORMAL HIGH (ref 70–99)
POTASSIUM: 3.8 meq/L (ref 3.5–5.1)
Sodium: 136 mEq/L (ref 135–145)
TOTAL PROTEIN: 6.6 g/dL (ref 6.0–8.3)
Total Bilirubin: 0.4 mg/dL (ref 0.2–1.2)

## 2018-06-25 LAB — LIPID PANEL
Cholesterol: 173 mg/dL (ref 0–200)
HDL: 66.1 mg/dL (ref >39.00)
LDL Cholesterol: 99 mg/dL (ref 0–99)
NonHDL: 107.34
Total CHOL/HDL Ratio: 3
Triglycerides: 42 mg/dL (ref 0.0–149.0)
VLDL: 8.4 mg/dL (ref 0.0–40.0)

## 2018-06-25 MED ORDER — INSULIN LISPRO (1 UNIT DIAL) 100 UNIT/ML (KWIKPEN): PEN_INJECTOR | SUBCUTANEOUS | 6 refills | Status: DC

## 2018-06-25 MED ORDER — BASAGLAR KWIKPEN 100 UNIT/ML ~~LOC~~ SOPN: PEN_INJECTOR | SUBCUTANEOUS | 6 refills | Status: DC

## 2018-06-25 NOTE — Assessment & Plan Note (Signed)
A1C improved Discussed the importance of testing sugars as a Type 1 DM, especially if taking insulin on a sliding scale Encouraged her to monitor carb intake, which she does not feel like she can adequately do at this time [facility] Will check CMET and Lipid profile today Foot exam today Encouraged yearly eye exam- will request copy of eye exam She declines flu or pneumovax today

## 2018-06-25 NOTE — Progress Notes (Signed)
Subjective:    Patient ID: Ellen [name], female    DOB: 1976/05/17, 42 y.o.   MRN: [id number]  HPI  Pt presents to the clinic today for follow up of DM 1. Her last A1C was 8.4%, 04/2018 (done by [facility]). She is taking Basaglar 12-14 units nights and Humalog per sliding scale as prescribed. She does not check her sugars as she is supposed to. She checks her feet daily. Her last eye exam was 04/2018. She does not take flu or pneumonia vaccines. She does not follow with endocrinology  Review of Systems      Past Medical History:  Diagnosis Date  . DM (diabetes mellitus), type 1 ([place])   . Frequent headaches   . Hyperlipidemia     Current Outpatient Medications  Medication Sig Dispense Refill  . glucagon (GLUCAGON EMERGENCY) 1 MG injection Inject 1 mg into the muscle once as needed. 2 each 12  . HUMALOG KWIKPEN 100 UNIT/ML KiwkPen INJECT UP TO 25 UNITS UNDER THE SKIN DAILY AS DIRECTED. 15 mL 4  . Insulin Glargine (BASAGLAR KWIKPEN) 100 UNIT/ML SOPN INJECT 0.12 MLS (12 UNITS TOTAL)INTO THESKIN 2 (TWO) TIMES DAILY 15 mL 0  . nitrofurantoin, macrocrystal-monohydrate, (MACROBID) 100 MG capsule Take 1 capsule (100 mg total) by mouth 2 (two) times daily. 10 capsule 0   No current facility-administered medications for this visit.     No Known Allergies  Family History  Problem Relation Age of Onset  . Hyperlipidemia Mother   . Cancer Father        prostate  . Arthritis Maternal Grandmother   . Hyperlipidemia Maternal Grandmother   . Hypertension Maternal Grandmother   . Stroke Maternal Grandfather   . Hypertension Maternal Grandfather   . Stroke Paternal Grandmother   . Diabetes Paternal Grandmother     Social History   Socioeconomic History  . Marital status: Married    Spouse name: Not on file  . Number of children: Not on file  . Years of education: Not on file  . Highest education level: Not on file  Occupational History  . Not on file  Social Needs  . Financial  resource strain: Not on file  . Food insecurity:    Worry: Not on file    Inability: Not on file  . Transportation needs:    Medical: Not on file    Non-medical: Not on file  Tobacco Use  . Smoking status: Never Smoker  . Smokeless tobacco: Never Used  Substance and Sexual Activity  . Alcohol use: No  . Drug use: No  . Sexual activity: Yes  Lifestyle  . Physical activity:    Days per week: Not on file    Minutes per session: Not on file  . Stress: Not on file  Relationships  . Social connections:    Talks on phone: Not on file    Gets together: Not on file    Attends religious service: Not on file    Active member of club or organization: Not on file    Attends meetings of clubs or organizations: Not on file    Relationship status: Not on file  . Intimate partner violence:    Fear of current or ex partner: Not on file    Emotionally abused: Not on file    Physically abused: Not on file    Forced sexual activity: Not on file  Other Topics Concern  . Not on file  Social History Narrative  . Not  on file     Constitutional: Denies fever, malaise, fatigue, headache or abrupt weight changes.  HEENT: Denies eye pain, eye redness, ear pain, ringing in the ears, wax buildup, runny nose, nasal congestion, bloody nose, or sore throat. Respiratory: Denies difficulty breathing, shortness of breath, cough or sputum production.   Cardiovascular: Denies chest pain, chest tightness, palpitations or swelling in the hands or feet.  Gastrointestinal: Denies abdominal pain, bloating, constipation, diarrhea or blood in the stool.  GU: Denies urgency, frequency, pain with urination, burning sensation, blood in urine, odor or discharge. Skin: Denies redness, rashes, lesions or ulcercations.  Neurological: Denies dizziness, difficulty with memory, difficulty with speech or problems with balance and coordination.   No other specific complaints in a complete review of systems (except as listed  in HPI above).  Objective:   Physical Exam  BP 116/80   Pulse 80   Temp 98.2 F (36.8 C) (Oral)   Wt 183 lb (83 kg)   SpO2 98%   BMI 30.45 kg/m  Wt Readings from Last 3 Encounters:  06/25/18 183 lb (83 kg)  07/11/17 176 lb (79.8 kg)  06/09/17 177 lb (80.3 kg)    General: Appears her stated age, well developed, well nourished in NAD. Skin: Warm, dry and intact. No ulcerations noted. Cardiovascular: Normal rate and rhythm. S1,S2 noted.  No murmur, rubs or gallops noted. No JVD or BLE edema. No carotid bruits noted. Pulmonary/Chest: Normal effort and positive vesicular breath sounds. No respiratory distress. No wheezes, rales or ronchi noted.  Neurological: Sensation intact to BLE. Had monofilament testing done by [facility], 04/2018.   BMET    Component Value Date/Time   NA 138 03/22/2016 0918   NA 140 03/21/2013   K 4.5 03/22/2016 0918   CL 102 03/22/2016 0918   CO2 31 03/22/2016 0918   GLUCOSE 189 (H) 03/22/2016 0918   BUN 14 03/22/2016 0918   BUN 14 03/21/2013   CREATININE 0.68 03/22/2016 0918   CALCIUM 9.0 03/22/2016 0918   GFRNONAA >60 10/18/2007 1053   GFRAA  10/18/2007 1053    >60        The eGFR has been calculated using the MDRD equation. This calculation has not been validated in all clinical    Lipid Panel     Component Value Date/Time   CHOL 161 03/22/2016 0918   TRIG 60.0 03/22/2016 0918   HDL 68.90 03/22/2016 0918   CHOLHDL 2 03/22/2016 0918   VLDL 12.0 03/22/2016 0918   LDLCALC 80 03/22/2016 0918    CBC    Component Value Date/Time   WBC 6.6 03/22/2016 0918   RBC 4.59 03/22/2016 0918   HGB 12.9 03/22/2016 0918   HCT 38.6 03/22/2016 0918   PLT 315.0 03/22/2016 0918   MCV 84.2 03/22/2016 0918   MCHC 33.5 03/22/2016 0918   RDW 13.4 03/22/2016 0918   LYMPHSABS 1.4 03/18/2015 1434   MONOABS 0.4 03/18/2015 1434   EOSABS 0.1 03/18/2015 1434   BASOSABS 0.1 03/18/2015 1434    Hgb A1C Lab Results  Component Value Date   HGBA1C 10.0 (H)  07/11/2017           Assessment & Plan:

## 2018-06-25 NOTE — Patient Instructions (Signed)
Diabetes Mellitus and Nutrition, Adult  When you have diabetes (diabetes mellitus), it is very important to have healthy eating habits because your blood sugar (glucose) levels are greatly affected by what you eat and drink. Eating healthy foods in the appropriate amounts, at about the same times every day, can help you:  · Control your blood glucose.  · Lower your risk of heart disease.  · Improve your blood pressure.  · Reach or maintain a healthy weight.  Every person with diabetes is different, and each person has different needs for a meal plan. Your health care provider may recommend that you work with a diet and nutrition specialist (dietitian) to make a meal plan that is best for you. Your meal plan may vary depending on factors such as:  · The calories you need.  · The medicines you take.  · Your weight.  · Your blood glucose, blood pressure, and cholesterol levels.  · Your activity level.  · Other health conditions you have, such as heart or kidney disease.  How do carbohydrates affect me?  Carbohydrates, also called carbs, affect your blood glucose level more than any other type of food. Eating carbs naturally raises the amount of glucose in your blood. Carb counting is a method for keeping track of how many carbs you eat. Counting carbs is important to keep your blood glucose at a healthy level, especially if you use insulin or take certain oral diabetes medicines.  It is important to know how many carbs you can safely have in each meal. This is different for every person. Your dietitian can help you calculate how many carbs you should have at each meal and for each snack.  Foods that contain carbs include:  · Bread, cereal, rice, pasta, and crackers.  · Potatoes and corn.  · Peas, beans, and lentils.  · Milk and yogurt.  · Fruit and juice.  · Desserts, such as cakes, cookies, ice cream, and candy.  How does alcohol affect me?  Alcohol can cause a sudden decrease in blood glucose (hypoglycemia),  especially if you use insulin or take certain oral diabetes medicines. Hypoglycemia can be a life-threatening condition. Symptoms of hypoglycemia (sleepiness, dizziness, and confusion) are similar to symptoms of having too much alcohol.  If your health care provider says that alcohol is safe for you, follow these guidelines:  · Limit alcohol intake to no more than 1 drink per day for nonpregnant women and 2 drinks per day for men. One drink equals 12 oz of beer, 5 oz of wine, or 1½ oz of hard liquor.  · Do not drink on an empty stomach.  · Keep yourself hydrated with water, diet soda, or unsweetened iced tea.  · Keep in mind that regular soda, juice, and other mixers may contain a lot of sugar and must be counted as carbs.  What are tips for following this plan?    Reading food labels  · Start by checking the serving size on the "Nutrition Facts" label of packaged foods and drinks. The amount of calories, carbs, fats, and other nutrients listed on the label is based on one serving of the item. Many items contain more than one serving per package.  · Check the total grams (g) of carbs in one serving. You can calculate the number of servings of carbs in one serving by dividing the total carbs by 15. For example, if a food has 30 g of total carbs, it would be equal to 2   servings of carbs.  · Check the number of grams (g) of saturated and trans fats in one serving. Choose foods that have low or no amount of these fats.  · Check the number of milligrams (mg) of salt (sodium) in one serving. Most people should limit total sodium intake to less than 2,300 mg per day.  · Always check the nutrition information of foods labeled as "low-fat" or "nonfat". These foods may be higher in added sugar or refined carbs and should be avoided.  · Talk to your dietitian to identify your daily goals for nutrients listed on the label.  Shopping  · Avoid buying canned, premade, or processed foods. These foods tend to be high in fat, sodium,  and added sugar.  · Shop around the outside edge of the grocery store. This includes fresh fruits and vegetables, bulk grains, fresh meats, and fresh dairy.  Cooking  · Use low-heat cooking methods, such as baking, instead of high-heat cooking methods like deep frying.  · Cook using healthy oils, such as olive, canola, or sunflower oil.  · Avoid cooking with butter, cream, or high-fat meats.  Meal planning  · Eat meals and snacks regularly, preferably at the same times every day. Avoid going long periods of time without eating.  · Eat foods high in fiber, such as fresh fruits, vegetables, beans, and whole grains. Talk to your dietitian about how many servings of carbs you can eat at each meal.  · Eat 4-6 ounces (oz) of lean protein each day, such as lean meat, chicken, fish, eggs, or tofu. One oz of lean protein is equal to:  ? 1 oz of meat, chicken, or fish.  ? 1 egg.  ? ¼ cup of tofu.  · Eat some foods each day that contain healthy fats, such as avocado, nuts, seeds, and fish.  Lifestyle  · Check your blood glucose regularly.  · Exercise regularly as told by your health care provider. This may include:  ? 150 minutes of moderate-intensity or vigorous-intensity exercise each week. This could be brisk walking, biking, or water aerobics.  ? Stretching and doing strength exercises, such as yoga or weightlifting, at least 2 times a week.  · Take medicines as told by your health care provider.  · Do not use any products that contain nicotine or tobacco, such as cigarettes and e-cigarettes. If you need help quitting, ask your health care provider.  · Work with a counselor or diabetes educator to identify strategies to manage stress and any emotional and social challenges.  Questions to ask a health care provider  · Do I need to meet with a diabetes educator?  · Do I need to meet with a dietitian?  · What number can I call if I have questions?  · When are the best times to check my blood glucose?  Where to find more  information:  · American Diabetes Association: diabetes.org  · Academy of Nutrition and Dietetics: www.eatright.org  · National Institute of Diabetes and Digestive and Kidney Diseases (NIH): www.niddk.nih.gov  Summary  · A healthy meal plan will help you control your blood glucose and maintain a healthy lifestyle.  · Working with a diet and nutrition specialist (dietitian) can help you make a meal plan that is best for you.  · Keep in mind that carbohydrates (carbs) and alcohol have immediate effects on your blood glucose levels. It is important to count carbs and to use alcohol carefully.  This information is not intended to   replace advice given to you by your health care provider. Make sure you discuss any questions you have with your health care provider.  Document Released: 03/10/2005 Document Revised: 01/11/2017 Document Reviewed: 07/18/2016  Elsevier Interactive Patient Education © 2019 [employer]

## 2018-07-12 ENCOUNTER — Encounter: Payer: Self-pay | Admitting: Internal Medicine

## 2018-07-12 ENCOUNTER — Ambulatory Visit: Payer: 59 | Admitting: Internal Medicine

## 2018-07-12 VITALS — BP 118/76 | HR 90 | Temp 98.2°F | Wt 180.0 lb

## 2018-07-12 DIAGNOSIS — R0981 Nasal congestion: Secondary | ICD-10-CM

## 2018-07-12 DIAGNOSIS — R52 Pain, unspecified: Secondary | ICD-10-CM

## 2018-07-12 DIAGNOSIS — J101 Influenza due to other identified influenza virus with other respiratory manifestations: Secondary | ICD-10-CM | POA: Diagnosis not present

## 2018-07-12 LAB — POC INFLUENZA A&B (BINAX/QUICKVUE)
INFLUENZA B, POC: POSITIVE — AB
Influenza A, POC: NEGATIVE

## 2018-07-12 MED ORDER — OSELTAMIVIR PHOSPHATE 75 MG PO CAPS: 75.0000 mg | ORAL_CAPSULE | Freq: Two times a day (BID) | ORAL | 0 refills | Status: DC

## 2018-07-12 NOTE — Progress Notes (Signed)
HPI  Pt presents to the clinic today with c/o nasal congestion and body aches. She reports this started yesterday. She is not able to blow anything out of her nose. She denies headache, runny nose, ear pain, sore throat or cough. She denies fever or chills. She has taken Ibuprofen with some relief. She has had sick contacts diagnosed with the flu. She did not get her flu shot.  Review of Systems      Past Medical History:  Diagnosis Date  . DM (diabetes mellitus), type 1 (H[facility])   . Frequent headaches   . Hyperlipidemia     Family History  Problem Relation Age of Onset  . Hyperlipidemia Mother   . Cancer Father        prostate  . Arthritis Maternal Grandmother   . Hyperlipidemia Maternal Grandmother   . Hypertension Maternal Grandmother   . Stroke Maternal Grandfather   . Hypertension Maternal Grandfather   . Stroke Paternal Grandmother   . Diabetes Paternal Grandmother     Social History   Socioeconomic History  . Marital status: Married    Spouse name: Not on file  . Number of children: Not on file  . Years of education: Not on file  . Highest education level: Not on file  Occupational History  . Not on file  Social Needs  . Financial resource strain: Not on file  . Food insecurity:    Worry: Not on file    Inability: Not on file  . Transportation needs:    Medical: Not on file    Non-medical: Not on file  Tobacco Use  . Smoking status: Never Smoker  . Smokeless tobacco: Never Used  Substance and Sexual Activity  . Alcohol use: No  . Drug use: No  . Sexual activity: Yes  Lifestyle  . Physical activity:    Days per week: Not on file    Minutes per session: Not on file  . Stress: Not on file  Relationships  . Social connections:    Talks on phone: Not on file    Gets together: Not on file    Attends religious service: Not on file    Active member of club or organization: Not on file    Attends meetings of clubs or organizations: Not on file   Relationship status: Not on file  . Intimate partner violence:    Fear of current or ex partner: Not on file    Emotionally abused: Not on file    Physically abused: Not on file    Forced sexual activity: Not on file  Other Topics Concern  . Not on file  Social History Narrative  . Not on file    No Known Allergies   Constitutional:  Positive body aches. Denies headache, fatigue, fever or abrupt weight changes.  HEENT:  Positive nasal congestion. Denies eye redness, eye pain, pressure behind the eyes, facial pain, ear pain, ringing in the ears, wax buildup, runny nose or bloody nose. Respiratory:  Denies cough, difficulty breathing or shortness of breath.  Cardiovascular: Denies chest pain, chest tightness, palpitations or swelling in the hands or feet.   No other specific complaints in a complete review of systems (except as listed in HPI above).  Objective:   Pulse 90   Temp 98.2 F (36.8 C) (Oral)   Wt 180 lb (81.6 kg)   LMP 07/12/2018   SpO2 98%   BMI 29.95 kg/m   Wt Readings from Last 3 Encounters:  07/12/18  180 lb (81.6 kg)  06/25/18 183 lb (83 kg)  07/11/17 176 lb (79.8 kg)     General: Appears her stated age, well developed, well nourished in NAD. HEENT: Head: normal shape and size, no sinus tenderness noted;Ears: Tm's gray and intact, normal light reflex; Nose: mucosa pink and moist, septum midline; Throat/Mouth: Teeth present, mucosa erythematous and moist, no exudate noted, no lesions or ulcerations noted.  Neck: No cervical lymphadenopathy.  Cardiovascular: Normal rate and rhythm. S1,S2 noted.  No murmur, rubs or gallops noted.  Pulmonary/Chest: Normal effort and positive vesicular breath sounds. No respiratory distress. No wheezes, rales or ronchi noted.       Assessment & Plan:   Nasal Congestion, Body Aches secondary to Influenza B:  Rapid flu: positive Get some rest and drink plenty of water eRx for Tamiflu 75 mg BID x 5 days Flonase OTC BID for  congestion  RTC as needed or if symptoms persist.   Nicki Reaperegina Baity, NP

## 2018-07-12 NOTE — Patient Instructions (Signed)

## 2018-07-12 NOTE — Addendum Note (Signed)
Addended by: Roena Malady on: 07/12/2018 04:46 PM   Modules accepted: Orders

## 2018-10-16 ENCOUNTER — Encounter: Payer: Self-pay | Admitting: Internal Medicine

## 2018-10-16 ENCOUNTER — Ambulatory Visit (INDEPENDENT_AMBULATORY_CARE_PROVIDER_SITE_OTHER): Payer: 59 | Admitting: Internal Medicine

## 2018-10-16 DIAGNOSIS — E109 Type 1 diabetes mellitus without complications: Secondary | ICD-10-CM

## 2018-10-16 DIAGNOSIS — L678 Other hair color and hair shaft abnormalities: Secondary | ICD-10-CM

## 2018-10-16 NOTE — Assessment & Plan Note (Addendum)
Will have her set up lab only appt for CBC, CMET, Lipid, A1C and urine microalbumin Encouraged her to consume a low carb diet Continue Basaglar and Humalog Encouraged her to check her feet routinely Encouraged yearly eye exams She declines flu and pneumovax

## 2018-10-16 NOTE — Patient Instructions (Signed)
Fat and Cholesterol Restricted Eating Plan Getting too much fat and cholesterol in your diet may cause health problems. Choosing the right foods helps keep your fat and cholesterol at normal levels. This can keep you from getting certain diseases. Your doctor may recommend an eating plan that includes:  Total fat: ______% or less of total calories a day.  Saturated fat: ______% or less of total calories a day.  Cholesterol: less than _________mg a day.  Fiber: ______g a day. What are tips for following this plan? Meal planning  At meals, divide your plate into four equal parts: ? Fill one-half of your plate with vegetables and green salads. ? Fill one-fourth of your plate with whole grains. ? Fill one-fourth of your plate with low-fat (lean) protein foods.  Eat fish that is high in omega-3 fats at least two times a week. This includes mackerel, tuna, sardines, and salmon.  Eat foods that are high in fiber, such as whole grains, beans, apples, broccoli, carrots, peas, and barley. General tips   Work with your doctor to lose weight if you need to.  Avoid: ? Foods with added sugar. ? Fried foods. ? Foods with partially hydrogenated oils.  Limit alcohol intake to no more than 1 drink a day for nonpregnant women and 2 drinks a day for men. One drink equals 12 oz of beer, 5 oz of wine, or 1 oz of hard liquor. Reading food labels  Check food labels for: ? Trans fats. ? Partially hydrogenated oils. ? Saturated fat (g) in each serving. ? Cholesterol (mg) in each serving. ? Fiber (g) in each serving.  Choose foods with healthy fats, such as: ? Monounsaturated fats. ? Polyunsaturated fats. ? Omega-3 fats.  Choose grain products that have whole grains. Look for the word "whole" as the first word in the ingredient list. Cooking  Cook foods using low-fat methods. These include baking, boiling, grilling, and broiling.  Eat more home-cooked foods. Eat at restaurants and buffets  less often.  Avoid cooking using saturated fats, such as butter, cream, palm oil, palm kernel oil, and coconut oil. Recommended foods  Fruits  All fresh, canned (in natural juice), or frozen fruits. Vegetables  Fresh or frozen vegetables (raw, steamed, roasted, or grilled). Green salads. Grains  Whole grains, such as whole wheat or whole grain breads, crackers, cereals, and pasta. Unsweetened oatmeal, bulgur, barley, quinoa, or brown rice. Corn or whole wheat flour tortillas. Meats and other protein foods  Ground beef (85% or leaner), grass-fed beef, or beef trimmed of fat. Skinless chicken or turkey. Ground chicken or turkey. Pork trimmed of fat. All fish and seafood. Egg whites. Dried beans, peas, or lentils. Unsalted nuts or seeds. Unsalted canned beans. Nut butters without added sugar or oil. Dairy  Low-fat or nonfat dairy products, such as skim or 1% milk, 2% or reduced-fat cheeses, low-fat and fat-free ricotta or cottage cheese, or plain low-fat and nonfat yogurt. Fats and oils  Tub margarine without trans fats. Light or reduced-fat mayonnaise and salad dressings. Avocado. Olive, canola, sesame, or safflower oils. The items listed above may not be a complete list of foods and beverages you can eat. Contact a dietitian for more information. Foods to avoid Fruits  Canned fruit in heavy syrup. Fruit in cream or butter sauce. Fried fruit. Vegetables  Vegetables cooked in cheese, cream, or butter sauce. Fried vegetables. Grains  White bread. White pasta. White rice. Cornbread. Bagels, pastries, and croissants. Crackers and snack foods that contain trans fat   and hydrogenated oils. Meats and other protein foods  Fatty cuts of meat. Ribs, chicken wings, bacon, sausage, bologna, salami, chitterlings, fatback, hot dogs, bratwurst, and packaged lunch meats. Liver and organ meats. Whole eggs and egg yolks. Chicken and turkey with skin. Fried meat. Dairy  Whole or 2% milk, cream,  half-and-half, and cream cheese. Whole milk cheeses. Whole-fat or sweetened yogurt. Full-fat cheeses. Nondairy creamers and whipped toppings. Processed cheese, cheese spreads, and cheese curds. Beverages  Alcohol. Sugar-sweetened drinks such as sodas, lemonade, and fruit drinks. Fats and oils  Butter, stick margarine, lard, shortening, ghee, or bacon fat. Coconut, palm kernel, and palm oils. Sweets and desserts  Corn syrup, sugars, honey, and molasses. Candy. Jam and jelly. Syrup. Sweetened cereals. Cookies, pies, cakes, donuts, muffins, and ice cream. The items listed above may not be a complete list of foods and beverages you should avoid. Contact a dietitian for more information. Summary  Choosing the right foods helps keep your fat and cholesterol at normal levels. This can keep you from getting certain diseases.  At meals, fill one-half of your plate with vegetables and green salads.  Eat high-fiber foods, like whole grains, beans, apples, carrots, peas, and barley.  Limit added sugar, saturated fats, alcohol, and fried foods. This information is not intended to replace advice given to you by your health care provider. Make sure you discuss any questions you have with your health care provider. Document Released: 12/13/2011 Document Revised: 02/14/2018 Document Reviewed: 02/28/2017 Elsevier Interactive Patient Education  2019 [employer] Inc[employer]   

## 2018-10-16 NOTE — Progress Notes (Signed)
Virtual Visit via Video Note  I connected with Ellen [name] on 10/16/18 at  2:15 PM EDT by a video enabled telemedicine application and verified that I am speaking with the correct person using two identifiers.   I discussed the limitations of evaluation and management by telemedicine and the availability of in person appointments. The patient expressed understanding and agreed to proceed.  Patient Location: In Her [employer] education officer Location: Office  History of Present Illness:  Pt due for follow up of chronic conditions.  DM 1: Her last A1C was 10%, 06/2017. She is not checking her sugars. She is taking 12-14 units of Basaglar BID. She takes Humalog per sliding scale. She denies hypoglycemia. She checks her feet routinely. Her last eye exam was 03/2018, no retionopathy. Flu never. Pneumovax > 5 years ago.  HLD: Her last LDL was 99, 05/2018. She is not taking any cholesterol lowering medication at this titme. She does not consume a low fat diet.    She does report her hair has become really brittle. She would like her thyroid and vitamin levels checked.  Past Medical History:  Diagnosis Date  . DM (diabetes mellitus), type 1 ([facility])   . Frequent headaches   . Hyperlipidemia     Current Outpatient Medications  Medication Sig Dispense Refill  . glucagon (GLUCAGON EMERGENCY) 1 MG injection Inject 1 mg into the muscle once as needed. 2 each 12  . Insulin Glargine (BASAGLAR KWIKPEN) 100 UNIT/ML SOPN INJECT 0.12 MLS (12 UNITS TOTAL)INTO THESKIN 2 (TWO) TIMES DAILY 15 mL 6  . insulin lispro (HUMALOG KWIKPEN) 100 UNIT/ML KwikPen INJECT UP TO 25 UNITS UNDER THE SKIN DAILY AS DIRECTED. 15 mL 6  . oseltamivir (TAMIFLU) 75 MG capsule Take 1 capsule (75 mg total) by mouth 2 (two) times daily. 10 capsule 0   No current facility-administered medications for this visit.     No Known Allergies  Family History  Problem Relation Age of Onset  . Hyperlipidemia Mother   . Cancer Father        prostate  . Arthritis Maternal Grandmother   . Hyperlipidemia Maternal Grandmother   . Hypertension Maternal Grandmother   . Stroke Maternal Grandfather   . Hypertension Maternal Grandfather   . Stroke Paternal Grandmother   . Diabetes Paternal Grandmother     Social History   Socioeconomic History  . Marital status: Married    Spouse name: Not on file  . Number of children: Not on file  . Years of education: Not on file  . Highest education level: Not on file  Occupational History  . Not on file  Social Needs  . Financial resource strain: Not on file  . Food insecurity:    Worry: Not on file    Inability: Not on file  . Transportation needs:    Medical: Not on file    Non-medical: Not on file  Tobacco Use  . Smoking status: Never Smoker  . Smokeless tobacco: Never Used  Substance and Sexual Activity  . Alcohol use: No  . Drug use: No  . Sexual activity: Yes  Lifestyle  . Physical activity:    Days per week: Not on file    Minutes per session: Not on file  . Stress: Not on file  Relationships  . Social connections:    Talks on phone: Not on file    Gets together: Not on file    Attends religious service: Not on file    Active member  of club or organization: Not on file    Attends meetings of clubs or organizations: Not on file    Relationship status: Not on file  . Intimate partner violence:    Fear of current or ex partner: Not on file    Emotionally abused: Not on file    Physically abused: Not on file    Forced sexual activity: Not on file  Other Topics Concern  . Not on file  Social History Narrative  . Not on file     Constitutional: Denies fever, malaise, fatigue, headache or abrupt weight changes.  HEENT: Pt reports brittle hair. Denies eye pain, eye redness, ear pain, ringing in the ears, wax buildup, runny nose, nasal congestion, bloody nose, or sore throat. Respiratory: Denies difficulty breathing, shortness of breath, cough or sputum  production.   Cardiovascular: Denies chest pain, chest tightness, palpitations or swelling in the hands or feet.  Gastrointestinal: Denies abdominal pain, bloating, constipation, diarrhea or blood in the stool.  GU: Denies urgency, frequency, pain with urination, burning sensation, blood in urine, odor or discharge. Musculoskeletal: Denies decrease in range of motion, difficulty with gait, muscle pain or joint pain and swelling.  Skin: Denies redness, rashes, lesions or ulcercations.  Neurological: Denies dizziness, difficulty with memory, difficulty with speech or problems with balance and coordination.  Psych: Denies anxiety, depression, SI/HI.  No other specific complaints in a complete review of systems (except as listed in HPI above).  Wt Readings from Last 3 Encounters:  07/12/18 180 lb (81.6 kg)  06/25/18 183 lb (83 kg)  07/11/17 176 lb (79.8 kg)    General: Appears her stated age,  in NAD. Skin: Warm, dry and intact.  Pulmonary/Chest: Normal effort. No respiratory distress.  Neurological: Alert and oriented.  Psychiatric: Mildly anxious appearing.   BMET    Component Value Date/Time   NA 136 06/25/2018 0841   NA 140 03/21/2013   K 3.8 06/25/2018 0841   CL 103 06/25/2018 0841   CO2 26 06/25/2018 0841   GLUCOSE 120 (H) 06/25/2018 0841   BUN 13 06/25/2018 0841   BUN 14 03/21/2013   CREATININE 0.62 06/25/2018 0841   CALCIUM 8.6 06/25/2018 0841   GFRNONAA >60 10/18/2007 1053   GFRAA  10/18/2007 1053    >60        The eGFR has been calculated using the MDRD equation. This calculation has not been validated in all clinical    Lipid Panel     Component Value Date/Time   CHOL 173 06/25/2018 0841   TRIG 42.0 06/25/2018 0841   HDL 66.10 06/25/2018 0841   CHOLHDL 3 06/25/2018 0841   VLDL 8.4 06/25/2018 0841   LDLCALC 99 06/25/2018 0841    CBC    Component Value Date/Time   WBC 6.6 03/22/2016 0918   RBC 4.59 03/22/2016 0918   HGB 12.9 03/22/2016 0918   HCT  38.6 03/22/2016 0918   PLT 315.0 03/22/2016 0918   MCV 84.2 03/22/2016 0918   MCHC 33.5 03/22/2016 0918   RDW 13.4 03/22/2016 0918   LYMPHSABS 1.4 03/18/2015 1434   MONOABS 0.4 03/18/2015 1434   EOSABS 0.1 03/18/2015 1434   BASOSABS 0.1 03/18/2015 1434    Hgb A1C Lab Results  Component Value Date   HGBA1C 10.0 (H) 07/11/2017         Assessment and Plan:  Brittle Hair:  Will check TSH, Vit D and B12  See problem based charting  Follow Up Instructions:    I  discussed the assessment and treatment plan with the patient. The patient was provided an opportunity to ask questions and all were answered. The patient agreed with the plan and demonstrated an understanding of the instructions.   The patient was advised to call back or seek an in-person evaluation if the symptoms worsen or if the condition fails to improve as anticipated.    Webb Silversmith, NP

## 2018-10-17 ENCOUNTER — Other Ambulatory Visit (INDEPENDENT_AMBULATORY_CARE_PROVIDER_SITE_OTHER): Payer: 59

## 2018-10-17 ENCOUNTER — Other Ambulatory Visit: Payer: Self-pay

## 2018-10-17 DIAGNOSIS — E109 Type 1 diabetes mellitus without complications: Secondary | ICD-10-CM

## 2018-10-17 DIAGNOSIS — L678 Other hair color and hair shaft abnormalities: Secondary | ICD-10-CM

## 2018-10-18 LAB — COMPREHENSIVE METABOLIC PANEL
ALT: 10 U/L (ref 0–35)
AST: 11 U/L (ref 0–37)
Albumin: 3.6 g/dL (ref 3.5–5.2)
Alkaline Phosphatase: 72 U/L (ref 39–117)
BUN: 13 mg/dL (ref 6–23)
CO2: 27 mEq/L (ref 19–32)
Calcium: 8.3 mg/dL — ABNORMAL LOW (ref 8.4–10.5)
Chloride: 99 mEq/L (ref 96–112)
Creatinine, Ser: 0.71 mg/dL (ref 0.40–1.20)
GFR: 90.62 mL/min (ref >60.00)
Glucose, Bld: 340 mg/dL — ABNORMAL HIGH (ref 70–99)
Potassium: 4.7 mEq/L (ref 3.5–5.1)
Sodium: 133 mEq/L — ABNORMAL LOW (ref 135–145)
Total Bilirubin: 0.4 mg/dL (ref 0.2–1.2)
Total Protein: 6.6 g/dL (ref 6.0–8.3)

## 2018-10-18 LAB — MICROALBUMIN / CREATININE URINE RATIO
Creatinine,U: 62.7 mg/dL
Microalb Creat Ratio: 1.1 mg/g (ref 0.0–30.0)
Microalb, Ur: <0.7 mg/dL (ref 0.0–1.9)

## 2018-10-18 LAB — CBC
HCT: 38 % (ref 36.0–46.0)
Hemoglobin: 12.9 g/dL (ref 12.0–15.0)
MCHC: 34 g/dL (ref 30.0–36.0)
MCV: 84.1 fl (ref 78.0–100.0)
Platelets: 343 10*3/uL (ref 150.0–400.0)
RBC: 4.52 Mil/uL (ref 3.87–5.11)
RDW: 13.6 % (ref 11.5–15.5)
WBC: 7.8 10*3/uL (ref 4.0–10.5)

## 2018-10-18 LAB — LIPID PANEL
Cholesterol: 169 mg/dL (ref 0–200)
HDL: 63.9 mg/dL (ref >39.00)
LDL Cholesterol: 93 mg/dL (ref 0–99)
NonHDL: 105.36
Total CHOL/HDL Ratio: 3
Triglycerides: 64 mg/dL (ref 0.0–149.0)
VLDL: 12.8 mg/dL (ref 0.0–40.0)

## 2018-10-18 LAB — TSH: TSH: 1.16 u[IU]/mL (ref 0.35–4.50)

## 2018-10-18 LAB — VITAMIN B12: Vitamin B-12: 376 pg/mL (ref 211–911)

## 2018-10-18 LAB — HEMOGLOBIN A1C: Hgb A1c MFr Bld: 10 % — ABNORMAL HIGH (ref 4.6–6.5)

## 2018-10-18 LAB — VITAMIN D 25 HYDROXY (VIT D DEFICIENCY, FRACTURES): VITD: 18.26 ng/mL — ABNORMAL LOW (ref 30.00–100.00)

## 2018-10-23 ENCOUNTER — Encounter: Payer: Self-pay | Admitting: Internal Medicine

## 2018-10-23 DIAGNOSIS — E1065 Type 1 diabetes mellitus with hyperglycemia: Secondary | ICD-10-CM

## 2018-10-23 MED ORDER — VITAMIN D (ERGOCALCIFEROL) 1.25 MG (50000 UNIT) PO CAPS: 50000.0000 [IU] | ORAL_CAPSULE | ORAL | 0 refills | Status: DC

## 2018-10-24 MED ORDER — BASAGLAR KWIKPEN 100 UNIT/ML ~~LOC~~ SOPN: PEN_INJECTOR | SUBCUTANEOUS | 0 refills | Status: DC

## 2018-10-24 MED ORDER — INSULIN LISPRO (1 UNIT DIAL) 100 UNIT/ML (KWIKPEN): PEN_INJECTOR | SUBCUTANEOUS | 0 refills | Status: DC

## 2018-10-24 NOTE — Addendum Note (Signed)
Addended by: Roena Malady on: 10/24/2018 08:51 AM   Modules accepted: Orders

## 2019-07-03 ENCOUNTER — Ambulatory Visit: Payer: 59 | Admitting: Internal Medicine

## 2019-07-03 ENCOUNTER — Encounter: Payer: Self-pay | Admitting: Family Medicine

## 2019-07-03 ENCOUNTER — Other Ambulatory Visit: Payer: Self-pay

## 2019-07-03 ENCOUNTER — Ambulatory Visit: Payer: 59 | Admitting: Family Medicine

## 2019-07-03 ENCOUNTER — Telehealth: Payer: Self-pay | Admitting: *Deleted

## 2019-07-03 VITALS — BP 118/76 | HR 92 | Temp 98.2°F | Wt 179.1 lb

## 2019-07-03 DIAGNOSIS — M898X1 Other specified disorders of bone, shoulder: Secondary | ICD-10-CM

## 2019-07-03 DIAGNOSIS — R079 Chest pain, unspecified: Secondary | ICD-10-CM | POA: Diagnosis not present

## 2019-07-03 DIAGNOSIS — E1065 Type 1 diabetes mellitus with hyperglycemia: Secondary | ICD-10-CM

## 2019-07-03 DIAGNOSIS — R0789 Other chest pain: Secondary | ICD-10-CM

## 2019-07-03 DIAGNOSIS — IMO0002 Reserved for concepts with insufficient information to code with codable children: Secondary | ICD-10-CM

## 2019-07-03 DIAGNOSIS — R1011 Right upper quadrant pain: Secondary | ICD-10-CM

## 2019-07-03 MED ORDER — OMEPRAZOLE 40 MG PO CPDR: 40.0000 mg | DELAYED_RELEASE_CAPSULE | Freq: Every day | ORAL | 1 refills | Status: DC

## 2019-07-03 MED ORDER — DEXCOM G6 RECEIVER DEVI: 1.0000 [IU] | 0 refills | Status: DC

## 2019-07-03 MED ORDER — DEXCOM G6 SENSOR MISC: 1.0000 [IU] | 11 refills | Status: DC

## 2019-07-03 MED ORDER — DEXCOM G6 TRANSMITTER MISC: 1.0000 [IU] | 3 refills | Status: DC

## 2019-07-03 NOTE — Telephone Encounter (Signed)
Patient's appointment was changed to Dr. Sharen Hones because patient was having chest pain and several complaints. Patient aware of appointment change.

## 2019-07-03 NOTE — Telephone Encounter (Signed)
Seen today. 

## 2019-07-03 NOTE — Patient Instructions (Addendum)
Story suspicious of gallstones - labs and EKG today, we will set you up for gallbladder ultrasound.  Avoid greasy foods for now.  Start omeprazole 40mg  daily.   Biliary Colic, Adult  Biliary colic is severe pain caused by a problem with a small organ in the upper right part of your belly (gallbladder). The gallbladder stores a digestive fluid produced in the liver (bile) that helps the body break down fat. Bile and other digestive enzymes are carried from the liver to the small intestine through tube-like structures (bile ducts). The gallbladder and the bile ducts form the biliary tract. Sometimes hard deposits of digestive fluids form in the gallbladder (gallstones) and block the flow of bile from the gallbladder, causing biliary colic. This condition is also called a gallbladder attack. Gallstones can be as small as a grain of sand or as big as a golf ball. There could be just one gallstone in the gallbladder, or there could be many. What are the causes? Biliary colic is usually caused by gallstones. Less often, a tumor could block the flow of bile from the gallbladder and trigger biliary colic. What increases the risk? This condition is more likely to develop in:  Women.  People of Hispanic descent.  People with a family history of gallstones.  People who are obese.  People who suddenly or quickly lose weight.  People who eat a high-calorie, low-fiber diet that is rich in refined carbs (carbohydrates), such as white bread and white rice.  People who have an intestinal disease that affects nutrient absorption, such as Crohn disease.  People who have a metabolic condition, such as metabolic syndrome or diabetes. What are the signs or symptoms? Severe pain in the upper right side of the belly is the main symptom of biliary colic. You may feel this pain below the chest but above the hip. This pain often occurs at night or after eating a very fatty meal. This pain may get worse for up to  an hour and last as long as 12 hours. In most cases, the pain fades (subsides) within a couple hours. Other symptoms of this condition include:  Nausea and vomiting.  Pain under the right shoulder. How is this diagnosed? This condition is diagnosed based on your medical history, your symptoms, and a physical exam. You may have tests, including:  Blood tests to rule out infection or inflammation of the bile ducts, gallbladder, pancreas, or liver.  Imaging studies such as: ? Ultrasound. ? CT scan. ? MRI. In some cases, you may need to have an imaging study done using a small amount of radioactive material (nuclear medicine) to confirm the diagnosis. How is this treated? Treatment for this condition may include medicine to relieve your pain or nausea. If you have gallstones that are causing biliary colic, you may need surgery to remove the gallbladder (cholecystectomy). Gallstones can also be dissolved gradually with medicine. It may take months or years before the gallstones are completely gone. Follow these instructions at home:  Take over-the-counter and prescription medicines only as told by your health care provider.  Drink enough fluid to keep your urine clear or pale yellow.  Follow instructions from your health care provider about eating or drinking restrictions. These may include avoiding: ? Fatty, greasy, and fried foods. ? Any foods that make the pain worse. ? Overeating. ? Having a large meal after not eating for a while.  Keep all follow-up visits as told by your health care provider. This is important. How  is this prevented? Steps to prevent this condition include:  Maintaining a healthy body weight.  Getting regular exercise.  Eating a healthy, high-fiber, low-fat diet.  Limiting how much sugar and refined carbs you eat, such as sweets, white flour, and white rice. Contact a health care provider if:  Your pain lasts more than 5 hours.  You vomit.  You have a  fever and chills.  Your pain gets worse. Get help right away if:  Your skin or the whites of your eyes look yellow (jaundice).  Your have tea-colored urine and light-colored stools.  You are dizzy or you faint. Summary  Biliary colic is severe pain caused by a problem with a small organ in the upper right part of your belly (gallbladder).  Treatments for this condition include medicines that relieves your pain or nausea and medicines that slowly dissolves the gallstones.  If gallstones cause your biliary colic, the treatment is surgery to remove the gallbladder (cholecystectomy). This information is not intended to replace advice given to you by your health care provider. Make sure you discuss any questions you have with your health care provider. Document Revised: 05/26/2017 Document Reviewed: 12/28/2015 Elsevier Patient Education  [place].

## 2019-07-03 NOTE — Telephone Encounter (Signed)
Appreciated Dr. Sharen Hones for seeing this pt.

## 2019-07-03 NOTE — Assessment & Plan Note (Addendum)
Suspicion for biliary colic - crescendo-decrescendo pain over several hours that started after greasy meal. Reviewed pathophysiology of gallstone pain. Will check CBC, CMP, lipase and limited RUQ abdominal ultrasound for further evaluation.  Will also start omeprazole 40mg  x3 wks then PRN.  Update if worsening symptoms for further urgent evaluation. Pt agrees with plan.

## 2019-07-03 NOTE — Assessment & Plan Note (Addendum)
Update A1c. Has not been checking sugars regularly. She is compliant with her insulin shots (lantus BID, humalog TID AC).  She requests dexcom CGM sent in. She will check on insurance coverage.  Encouraged she schedule DM f/u with Rene Kocher as overdue.

## 2019-07-03 NOTE — Telephone Encounter (Signed)
Patient called to schedule an appointment and was transferred to triage because of her symptoms. Patient stated that on Monday she started with some tingling from her right hand to her elbow. Patient stated that last night she started with chest pain that went into her back. Patient stated that she did have pizza last night for dinner and has a history of heartburn. Patient stated that she did chew some tums and after a while the pain subsided. Patient stated that she is not having any chest or back pain this morning. Patient stated that she has felt like there is a lump in her throat and could burp. Patient was scheduled to see Nicki Reaper NP today and advised to eat a bland diet until she comes for her appointment today. ER precautions were given to patient and she verbalized understanding. Patient had a negative covid screening.

## 2019-07-03 NOTE — Progress Notes (Signed)
This visit was conducted in person.  BP 118/76 (BP Location: Right Arm, Patient Position: Sitting, Cuff Size: Normal)   Pulse 92   Temp 98.2 F (36.8 C) (Temporal)   Wt 179 lb 1.6 oz (81.2 kg)   SpO2 97%   BMI 29.80 kg/m    CC: chest discomfort, R arm discomfort Subjective:    Patient ID: Ellen [name], female    DOB: March 27, 1976, 44 y.o.   MRN: [id number]  HPI: Ellen [name] is a 44 y.o. female presenting on 07/03/2019 for Chest Pain   4d h/o R arm pain, numbness, paresthesias from elbow to wrist. 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   Had pizza for dinner last night. Woke up at 3:15am this morning with intense constant chest pain described as painful pressure associated with globus sensation in upper chest. Worse when laying on left side, R arm pain worsened and traveled from shoulder to upper back behind shoulderblade. Treated with tums with some benefit. Propping up when supine helped as well. Discomfort lasted 1.5 hours. Today hasn't had any chest pain, has been active. Notes persistent pressure discomfort at R upper back behind shoulder blade.   No fevers/chills, abd pain, nausea/vomiting, dizziness, dyspnea, HA, cough, diarrhea, constipation, no significant heartburn. No reproducible chest wall pain. This does not feel like when she previously had heartburn. No boring pain to the back.   Known T1DM on lantus and humalog 1:10 carb counting. Followed by Rollene Fare. Checks sugars a few times a day.  Lab Results  Component Value Date   HGBA1C 10.0 (H) 10/17/2018  Requests CGM - would like to use dexcom.      Relevant past medical, surgical, family and social history reviewed and updated as indicated. Interim medical history since our last visit reviewed. Allergies and medications reviewed and updated. Outpatient Medications Prior to Visit  Medication Sig Dispense Refill  . glucagon (GLUCAGON EMERGENCY) 1 MG injection Inject 1 mg into the muscle once as needed. 2  each 12  . insulin glargine (LANTUS) 100 UNIT/ML injection Inject 14 Units into the skin daily. Inject 14 units once in the AM and once in the PM    . insulin lispro (HUMALOG KWIKPEN) 100 UNIT/ML KwikPen INJECT UP TO 25 UNITS UNDER THE SKIN DAILY AS DIRECTED. 15 mL 0  . Vitamin D, Ergocalciferol, (DRISDOL) 1.25 MG (50000 UT) CAPS capsule Take 1 capsule (50,000 Units total) by mouth every 7 (seven) days. 12 capsule 0  . Insulin Glargine (BASAGLAR KWIKPEN) 100 UNIT/ML SOPN INJECT 0.12 MLS (12 UNITS TOTAL)INTO THESKIN 2 (TWO) TIMES DAILY (Patient not taking: Reported on 07/03/2019) 15 mL 0   No facility-administered medications prior to visit.     Per HPI unless specifically indicated in ROS section below Review of Systems Objective:    BP 118/76 (BP Location: Right Arm, Patient Position: Sitting, Cuff Size: Normal)   Pulse 92   Temp 98.2 F (36.8 C) (Temporal)   Wt 179 lb 1.6 oz (81.2 kg)   SpO2 97%   BMI 29.80 kg/m   Wt Readings from Last 3 Encounters:  07/03/19 179 lb 1.6 oz (81.2 kg)  07/12/18 180 lb (81.6 kg)  06/25/18 183 lb (83 kg)    Physical Exam    Results for orders placed or performed in visit on 10/17/18  VITAMIN D 25 Hydroxy (Vit-D Deficiency, Fractures)  Result Value Ref Range   VITD 18.26 (L) 30.00 - 100.00 ng/mL  Vitamin B12  Result Value Ref Range   Vitamin B-12 376 211 - 911 pg/mL  Microalbumin / creatinine urine ratio  Result Value Ref Range   Microalb, Ur <0.7 0.0 - 1.9 mg/dL   Creatinine,U 38.1 mg/dL   Microalb Creat Ratio 1.1 0.0 - 30.0 mg/g  Hemoglobin A1c  Result Value Ref Range   Hgb A1c MFr Bld 10.0 (H) 4.6 - 6.5 %  Lipid panel  Result Value Ref Range   Cholesterol 169 0 - 200 mg/dL   Triglycerides 77.1 0.0 - 149.0 mg/dL   HDL 16.57 >90.38 mg/dL   VLDL 33.3 0.0 - 83.2 mg/dL   LDL Cholesterol 93 0 - 99 mg/dL   Total CHOL/HDL Ratio 3    NonHDL 105.36   TSH  Result Value Ref Range   TSH 1.16 0.35 - 4.50 uIU/mL  Comprehensive metabolic panel    Result Value Ref Range   Sodium 133 (L) 135 - 145 mEq/L   Potassium 4.7 3.5 - 5.1 mEq/L   Chloride 99 96 - 112 mEq/L   CO2 27 19 - 32 mEq/L   Glucose, Bld 340 (H) 70 - 99 mg/dL   BUN 13 6 - 23 mg/dL   Creatinine, Ser 9.19 0.40 - 1.20 mg/dL   Total Bilirubin 0.4 0.2 - 1.2 mg/dL   Alkaline Phosphatase 72 39 - 117 U/L   AST 11 0 - 37 U/L   ALT 10 0 - 35 U/L   Total Protein 6.6 6.0 - 8.3 g/dL   Albumin 3.6 3.5 - 5.2 g/dL   Calcium 8.3 (L) 8.4 - 10.5 mg/dL   GFR 16.60 >60.04 mL/min  CBC  Result Value Ref Range   WBC 7.8 4.0 - 10.5 K/uL   RBC 4.52 3.87 - 5.11 Mil/uL   Platelets 343.0 150.0 - 400.0 K/uL   Hemoglobin 12.9 12.0 - 15.0 g/dL   HCT 59.9 77.4 - 14.2 %   MCV 84.1 78.0 - 100.0 fl   MCHC 34.0 30.0 - 36.0 g/dL   RDW 39.5 32.0 - 23.3 %   EKG - NSR rate 75, normal axis, intervals, no acute ST/T changes Assessment & Plan:  This visit occurred during the SARS-CoV-2 public health emergency.  Safety protocols were in place, including screening questions prior to the visit, additional usage of staff PPE, and extensive cleaning of exam room while observing appropriate contact time as indicated for disinfecting solutions.   Problem List Items Addressed This Visit    Diabetes mellitus type 1, uncontrolled, insulin dependent (HCC)    Update A1c. Has not been checking sugars regularly. She is compliant with her insulin shots (lantus BID, humalog TID AC).  She requests dexcom CGM sent in. She will check on insurance coverage.  Encouraged she schedule DM f/u with Rene Kocher as overdue.       Relevant Medications   insulin glargine (LANTUS) 100 UNIT/ML injection   Chest discomfort - Primary    Actually does not sound cardiac in nature (not exertional, not relieved by rest) but rather possibly related to biliary colic - see below.  In diabetic, reasonable to check EKG for baseline. Pt agrees with plan.       Relevant Orders   Comprehensive metabolic panel   CBC with Differential    Lipase   US Abdomen Limited RUQ   EKG 12-Lead (Completed)   Abdominal pain, RUQ    Suspicion for biliary colic - crescendo-decrescendo pain over several hours that started after greasy meal. Reviewed pathophysiology of gallstone pain. Will check  CBC, CMP, lipase and limited RUQ abdominal ultrasound for further evaluation.  Will also start omeprazole 40mg  x3 wks then PRN.  Update if worsening symptoms for further urgent evaluation. Pt agrees with plan.       Relevant Orders   Abdomen Limited RUQ    Other Visit Diagnoses    Pain of right scapula       Relevant Orders   Comprehensive metabolic panel   CBC with Differential   Lipase   US Abdomen Limited RUQ       Meds ordered this encounter  Medications  . omeprazole (PRILOSEC) 40 MG capsule    Sig: Take 1 capsule (40 mg total) by mouth daily. Daily for 3 weeks then as needed    Dispense:  30 capsule    Refill:  1  . Continuous Blood Gluc Receiver (DEXCOM G6 RECEIVER) DEVI    Sig: 1 Units by Does not apply route as directed.    Dispense:  1 each    Refill:  0  . Continuous Blood Gluc Sensor (DEXCOM G6 SENSOR) MISC    Sig: 1 Units by Does not apply route as directed.    Dispense:  1 each    Refill:  11  . Continuous Blood Gluc Transmit (DEXCOM G6 TRANSMITTER) MISC    Sig: 1 Units by Does not apply route as directed.    Dispense:  1 each    Refill:  3   Orders Placed This Encounter  Procedures  . US Abdomen Limited RUQ    Standing Status:   Future    Standing Expiration Date:   08/30/2020    Order Specific Question:   Reason for Exam (SYMPTOM  OR DIAGNOSIS REQUIRED)    Answer:   RUQ abd pain, biliary colic    Order Specific Question:   Preferred imaging location?    Answer:   [facility]    Order Specific Question:   Call Results- Best Contact Number?    Answer5/11/2020  . Comprehensive metabolic panel  . CBC with Differential  . Lipase  . EKG 12-Lead    Patient instructions: Story suspicious of  gallstones - labs and EKG today, we will set you up for gallbladder ultrasound.  Avoid greasy foods for now.  Start omeprazole 40mg  daily.   Follow up plan: No follow-ups on file.  :   [phone], MD

## 2019-07-03 NOTE — Assessment & Plan Note (Signed)
Actually does not sound cardiac in nature (not exertional, not relieved by rest) but rather possibly related to biliary colic - see below.  In diabetic, reasonable to check EKG for baseline. Pt agrees with plan.

## 2019-07-04 ENCOUNTER — Telehealth: Payer: Self-pay

## 2019-07-04 ENCOUNTER — Encounter: Payer: Self-pay | Admitting: Family Medicine

## 2019-07-04 ENCOUNTER — Ambulatory Visit
Admission: RE | Admit: 2019-07-04 | Discharge: 2019-07-04 | Disposition: A | Discharge status: Home / Self Care | Payer: 59 | Source: Ambulatory Visit | Attending: Family Medicine | Admitting: Family Medicine

## 2019-07-04 ENCOUNTER — Other Ambulatory Visit: Payer: Self-pay

## 2019-07-04 DIAGNOSIS — R1011 Right upper quadrant pain: Secondary | ICD-10-CM | POA: Diagnosis present

## 2019-07-04 DIAGNOSIS — M898X1 Other specified disorders of bone, shoulder: Secondary | ICD-10-CM | POA: Diagnosis present

## 2019-07-04 DIAGNOSIS — R0789 Other chest pain: Secondary | ICD-10-CM | POA: Insufficient documentation

## 2019-07-04 LAB — CBC WITH DIFFERENTIAL/PLATELET
Basophils Absolute: 0.1 10*3/uL (ref 0.0–0.1)
Basophils Relative: 1 % (ref 0.0–3.0)
Eosinophils Absolute: 1.2 10*3/uL — ABNORMAL HIGH (ref 0.0–0.7)
Eosinophils Relative: 10.8 % — ABNORMAL HIGH (ref 0.0–5.0)
HCT: 39.7 % (ref 36.0–46.0)
Hemoglobin: 12.9 g/dL (ref 12.0–15.0)
Lymphocytes Relative: 12.5 % (ref 12.0–46.0)
Lymphs Abs: 1.4 10*3/uL (ref 0.7–4.0)
MCHC: 32.4 g/dL (ref 30.0–36.0)
MCV: 86 fl (ref 78.0–100.0)
Monocytes Absolute: 0.6 10*3/uL (ref 0.1–1.0)
Monocytes Relative: 5.6 % (ref 3.0–12.0)
Neutro Abs: 8 10*3/uL — ABNORMAL HIGH (ref 1.4–7.7)
Neutrophils Relative %: 70.1 % (ref 43.0–77.0)
Platelets: 305 10*3/uL (ref 150.0–400.0)
RBC: 4.62 Mil/uL (ref 3.87–5.11)
RDW: 13.1 % (ref 11.5–15.5)
WBC: 11.4 10*3/uL — ABNORMAL HIGH (ref 4.0–10.5)

## 2019-07-04 LAB — COMPREHENSIVE METABOLIC PANEL
ALT: 11 U/L (ref 0–35)
AST: 11 U/L (ref 0–37)
Albumin: 4 g/dL (ref 3.5–5.2)
Alkaline Phosphatase: 75 U/L (ref 39–117)
BUN: 18 mg/dL (ref 6–23)
CO2: 30 mEq/L (ref 19–32)
Calcium: 9.7 mg/dL (ref 8.4–10.5)
Chloride: 99 mEq/L (ref 96–112)
Creatinine, Ser: 0.74 mg/dL (ref 0.40–1.20)
GFR: 86.1 mL/min (ref >60.00)
Glucose, Bld: 352 mg/dL — ABNORMAL HIGH (ref 70–99)
Potassium: 4.7 mEq/L (ref 3.5–5.1)
Sodium: 134 mEq/L — ABNORMAL LOW (ref 135–145)
Total Bilirubin: 0.4 mg/dL (ref 0.2–1.2)
Total Protein: 6.9 g/dL (ref 6.0–8.3)

## 2019-07-04 LAB — LIPASE: Lipase: 5 U/L — ABNORMAL LOW (ref 11.0–59.0)

## 2019-07-04 NOTE — Telephone Encounter (Signed)
Noted  

## 2019-07-04 NOTE — Telephone Encounter (Signed)
Victorino Dike with [facility] called result on US done STAT today. Result is normal. Results are in the imagine section.

## 2019-07-05 ENCOUNTER — Other Ambulatory Visit (INDEPENDENT_AMBULATORY_CARE_PROVIDER_SITE_OTHER): Payer: 59

## 2019-07-05 DIAGNOSIS — E1051 Type 1 diabetes mellitus with diabetic peripheral angiopathy without gangrene: Secondary | ICD-10-CM

## 2019-07-05 DIAGNOSIS — E1065 Type 1 diabetes mellitus with hyperglycemia: Secondary | ICD-10-CM

## 2019-07-05 DIAGNOSIS — IMO0002 Reserved for concepts with insufficient information to code with codable children: Secondary | ICD-10-CM

## 2019-07-05 LAB — HEMOGLOBIN A1C: Hgb A1c MFr Bld: 9.9 % — ABNORMAL HIGH (ref 4.6–6.5)

## 2019-07-06 ENCOUNTER — Encounter: Payer: Self-pay | Admitting: Family Medicine

## 2019-07-06 NOTE — Telephone Encounter (Signed)
Replied via lab result  

## 2019-07-11 NOTE — Telephone Encounter (Signed)
Tried to call patient, straight to voicemail and mailbox is full. Will send mychart message. If ongoing pressure, will recommend HIDA and if normal the f/u with PCP.

## 2019-07-31 ENCOUNTER — Other Ambulatory Visit: Payer: Self-pay | Admitting: Internal Medicine

## 2019-07-31 DIAGNOSIS — E1065 Type 1 diabetes mellitus with hyperglycemia: Secondary | ICD-10-CM

## 2019-08-05 ENCOUNTER — Encounter: Payer: Self-pay | Admitting: Internal Medicine

## 2019-08-05 DIAGNOSIS — E1065 Type 1 diabetes mellitus with hyperglycemia: Secondary | ICD-10-CM

## 2019-08-08 ENCOUNTER — Encounter: Payer: Self-pay | Admitting: Internal Medicine

## 2019-08-08 ENCOUNTER — Telehealth: Payer: Self-pay | Admitting: Internal Medicine

## 2019-08-08 DIAGNOSIS — E1065 Type 1 diabetes mellitus with hyperglycemia: Secondary | ICD-10-CM

## 2019-08-08 MED ORDER — INSULIN LISPRO (1 UNIT DIAL) 100 UNIT/ML (KWIKPEN): PEN_INJECTOR | SUBCUTANEOUS | 3 refills | Status: DC

## 2019-08-08 MED ORDER — LANTUS SOLOSTAR 100 UNIT/ML ~~LOC~~ SOPN: PEN_INJECTOR | SUBCUTANEOUS | 3 refills | Status: DC

## 2019-08-08 MED ORDER — INSULIN LISPRO (1 UNIT DIAL) 100 UNIT/ML (KWIKPEN): PEN_INJECTOR | SUBCUTANEOUS | 0 refills | Status: DC

## 2019-08-08 NOTE — Telephone Encounter (Signed)
Ellen [name] is checking on rx for  lantus solo star  Directions Use up to 15 units  Twice daily  This way they can 3 lantus pens per month

## 2019-08-08 NOTE — Telephone Encounter (Signed)
Rx sent through e-scribe Pt is aware  

## 2019-09-20 ENCOUNTER — Other Ambulatory Visit: Payer: Self-pay | Admitting: Internal Medicine

## 2019-09-20 ENCOUNTER — Other Ambulatory Visit: Payer: Self-pay | Admitting: Family Medicine

## 2019-09-20 DIAGNOSIS — E1065 Type 1 diabetes mellitus with hyperglycemia: Secondary | ICD-10-CM

## 2019-09-20 MED ORDER — LANTUS SOLOSTAR 100 UNIT/ML ~~LOC~~ SOPN: PEN_INJECTOR | SUBCUTANEOUS | 3 refills | Status: DC

## 2019-09-20 NOTE — Telephone Encounter (Signed)
Spoke with Tawanna Cooler at [employer]. He wanted to explain the side note on the medication request. Tawanna Cooler states to help patient with cost and for insurance to cover medication appropriately they are asking if RX can be resent with directions of use 15 units 2 times daily or increase as directed #15 mol for 30 day supply. With #9 ml that was sent in originally they would have to break the box for this supply and they are not suppose to break them.

## 2019-09-20 NOTE — Addendum Note (Signed)
Addended by: Consuella Lose on: 09/20/2019 12:47 PM   Modules accepted: Orders

## 2019-12-11 DIAGNOSIS — N644 Mastodynia: Secondary | ICD-10-CM | POA: Insufficient documentation

## 2019-12-12 ENCOUNTER — Other Ambulatory Visit: Payer: Self-pay | Admitting: Podiatry

## 2019-12-12 ENCOUNTER — Ambulatory Visit: Payer: 59 | Admitting: Podiatry

## 2019-12-12 ENCOUNTER — Other Ambulatory Visit: Payer: Self-pay

## 2019-12-12 ENCOUNTER — Ambulatory Visit (INDEPENDENT_AMBULATORY_CARE_PROVIDER_SITE_OTHER): Payer: 59

## 2019-12-12 ENCOUNTER — Encounter: Payer: Self-pay | Admitting: Podiatry

## 2019-12-12 DIAGNOSIS — M216X2 Other acquired deformities of left foot: Secondary | ICD-10-CM

## 2019-12-12 DIAGNOSIS — M216X1 Other acquired deformities of right foot: Secondary | ICD-10-CM

## 2019-12-12 DIAGNOSIS — M722 Plantar fascial fibromatosis: Secondary | ICD-10-CM

## 2019-12-12 DIAGNOSIS — M79673 Pain in unspecified foot: Secondary | ICD-10-CM | POA: Diagnosis not present

## 2019-12-13 ENCOUNTER — Encounter: Payer: Self-pay | Admitting: Podiatry

## 2019-12-13 NOTE — Progress Notes (Signed)
Subjective:  Patient ID: Ellen [name], female    DOB: 1975-12-03,  MRN: [id number]  Chief Complaint  Patient presents with  . Foot Problem    the left arch is hurting some and the right has a knot in the arch area     44 y.o. female presents with the above complaint.  Patient presents with bilateral arch pain as well as right concern for plantar fibroma pain.  Patient states is very mild the pain however she wants to get evaluated make sure is not going to get worse.  She states is been going on since Monday he has gotten swollen has gone down since Monday hurts to walk.  Patient is tried ice water bottle.  The arch has been hurting a lot there is tenderness to it.  Patient is a type I diabetic.  There is no burning sensation.  She denies any other acute complaints.  She has not seen anyone else prior to seeing me.   Review of Systems: Negative except as noted in the HPI. Denies N/V/F/Ch.  Past Medical History:  Diagnosis Date  . DM (diabetes mellitus), type 1 (HCC)   . Frequent headaches   . Hyperlipidemia     Current Outpatient Medications:  .  glucagon (GLUCAGON EMERGENCY) 1 MG injection, Inject 1 mg into the muscle once as needed., Disp: 2 each, Rfl: 12 .  insulin glargine (LANTUS SOLOSTAR) 100 UNIT/ML Solostar Pen, INJECT 15 UNITS UNDER THE SKIN 2 TIMES DAILY or increase as directed, Disp: 15 mL, Rfl: 3 .  insulin lispro (HUMALOG KWIKPEN) 100 UNIT/ML KwikPen, INJECT UP TO 50 UNITS UNDER THE SKIN DAILY AS DIRECTED, Disp: 15 mL, Rfl: 3 .  omeprazole (PRILOSEC) 40 MG capsule, TAKE 1 CAPSULE BY MOUTH ONCE DAILY FOR THREE WEEKS THEN AS NEEDED, Disp: 30 capsule, Rfl: 1 .  Vitamin D, Ergocalciferol, (DRISDOL) 1.25 MG (50000 UT) CAPS capsule, Take 1 capsule (50,000 Units total) by mouth every 7 (seven) days., Disp: 12 capsule, Rfl: 0  Social History   Tobacco Use  Smoking Status Never Smoker  Smokeless Tobacco Never Used    No Known Allergies Objective:  There were no vitals filed  for this visit. There is no height or weight on file to calculate BMI. Constitutional Well developed. Well nourished.  Vascular Dorsalis pedis pulses palpable bilaterally. Posterior tibial pulses palpable bilaterally. Capillary refill normal to all digits.  No cyanosis or clubbing noted. Pedal hair growth normal.  Neurologic Normal speech. Oriented to person, place, and time. Epicritic sensation to light touch grossly present bilaterally.  Dermatologic Nails well groomed and normal in appearance. No open wounds. No skin lesions.  Orthopedic:  Soft palpable small mass indurated in nature noted along the plantar fascia.  Likely plantar fibroma.  Patient also has a pes cavus foot structure with high arch foot and excessive pressure on the ball of the foot.  Bilateral subjective arch pain.   Radiographs: 3 views of skeletally mature adult foot: There is increase in calcaneal inclination angle decrease in talar declination angle: Mild elevatus of the first ray noted.  The abnormalities identified.  Findings are consistent with bilaterally. Assessment:   1. Acquired bilateral pes cavus   2. 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  Bilateral arch pain with underlying pes cavus foot structure -I explained patient the etiology of arch pain and various treatment options were  extensively discussed.  I believe the underlying etiology of pes cavus foot structure is the primary cause of the pain that she is having without being supported.  I discussed with the patient shoe gear modification as well as orthotics management. -Should be scheduled to see Liliane Channel for custom-made orthotics to help support the arch of the foot control the hindfoot motion.  Right plantar fibroma -Clinically, on palpation is not very painful and therefore we will continue to monitor the progression of it.  If there  is a lot of pain in the future we will consider removing at that time.  Patient agrees with the plan.  No follow-ups on file.

## 2019-12-31 ENCOUNTER — Telehealth: Payer: Self-pay | Admitting: Podiatry

## 2019-12-31 NOTE — Telephone Encounter (Signed)
Pt left message stating she was to receive a call about orthotic coverage and she would need to cancel the appt for 7.7 if not covered.    Upon looking she canceled appt with the [place] office because she said they were not covered by insurance .

## 2020-01-01 ENCOUNTER — Other Ambulatory Visit: Payer: 59 | Admitting: Orthotics

## 2020-01-01 NOTE — Telephone Encounter (Signed)
At that [place] [facility] did cover orthotics based on some diagnosis code.  Do I need to do anything to change diagnosis codes

## 2020-01-30 ENCOUNTER — Encounter: Payer: Self-pay | Admitting: Internal Medicine

## 2020-01-30 MED ORDER — FREESTYLE LIBRE 14 DAY SENSOR MISC: 1.0000 | 0 refills | Status: DC

## 2020-01-30 MED ORDER — FREESTYLE LIBRE 14 DAY READER DEVI: 1.0000 | Freq: Once | 0 refills | Status: AC

## 2020-02-25 ENCOUNTER — Other Ambulatory Visit: Payer: Self-pay | Admitting: Internal Medicine

## 2020-02-25 DIAGNOSIS — E1065 Type 1 diabetes mellitus with hyperglycemia: Secondary | ICD-10-CM

## 2020-03-03 LAB — HM PAP SMEAR

## 2020-03-25 ENCOUNTER — Telehealth: Payer: Self-pay

## 2020-03-25 ENCOUNTER — Other Ambulatory Visit: Payer: Self-pay | Admitting: Internal Medicine

## 2020-03-25 DIAGNOSIS — E1065 Type 1 diabetes mellitus with hyperglycemia: Secondary | ICD-10-CM

## 2020-03-25 MED ORDER — FREESTYLE LIBRE 2 SENSOR MISC: 1.0000 | 1 refills | Status: DC

## 2020-03-25 NOTE — Telephone Encounter (Signed)
Landon with [facility] left v/m that pt has a voucher for [employer] II and needs new rx sent for freestyle libre II sensors. Pt already has the reading device.

## 2020-04-07 ENCOUNTER — Other Ambulatory Visit: Payer: Self-pay

## 2020-04-07 ENCOUNTER — Ambulatory Visit (INDEPENDENT_AMBULATORY_CARE_PROVIDER_SITE_OTHER): Payer: 59 | Admitting: Internal Medicine

## 2020-04-07 ENCOUNTER — Encounter: Payer: Self-pay | Admitting: Internal Medicine

## 2020-04-07 ENCOUNTER — Ambulatory Visit (INDEPENDENT_AMBULATORY_CARE_PROVIDER_SITE_OTHER)
Admission: RE | Admit: 2020-04-07 | Discharge: 2020-04-07 | Disposition: A | Discharge status: Home / Self Care | Payer: 59 | Source: Ambulatory Visit | Attending: Internal Medicine | Admitting: Internal Medicine

## 2020-04-07 VITALS — BP 116/70 | HR 78 | Temp 98.1°F | Ht 65.0 in | Wt 186.0 lb

## 2020-04-07 DIAGNOSIS — M25562 Pain in left knee: Secondary | ICD-10-CM

## 2020-04-07 DIAGNOSIS — M25561 Pain in right knee: Secondary | ICD-10-CM | POA: Diagnosis not present

## 2020-04-07 DIAGNOSIS — Z0000 Encounter for general adult medical examination without abnormal findings: Secondary | ICD-10-CM | POA: Diagnosis not present

## 2020-04-07 DIAGNOSIS — G8929 Other chronic pain: Secondary | ICD-10-CM

## 2020-04-07 DIAGNOSIS — E1065 Type 1 diabetes mellitus with hyperglycemia: Secondary | ICD-10-CM | POA: Diagnosis not present

## 2020-04-07 DIAGNOSIS — IMO0002 Reserved for concepts with insufficient information to code with codable children: Secondary | ICD-10-CM

## 2020-04-07 LAB — COMPREHENSIVE METABOLIC PANEL
ALT: 10 U/L (ref 0–35)
AST: 11 U/L (ref 0–37)
Albumin: 3.9 g/dL (ref 3.5–5.2)
Alkaline Phosphatase: 62 U/L (ref 39–117)
BUN: 13 mg/dL (ref 6–23)
CO2: 29 mEq/L (ref 19–32)
Calcium: 8.9 mg/dL (ref 8.4–10.5)
Chloride: 104 mEq/L (ref 96–112)
Creatinine, Ser: 0.61 mg/dL (ref 0.40–1.20)
GFR: 111.27 mL/min (ref >60.00)
Glucose, Bld: 104 mg/dL — ABNORMAL HIGH (ref 70–99)
Potassium: 4.4 mEq/L (ref 3.5–5.1)
Sodium: 139 mEq/L (ref 135–145)
Total Bilirubin: 0.5 mg/dL (ref 0.2–1.2)
Total Protein: 6.7 g/dL (ref 6.0–8.3)

## 2020-04-07 LAB — LIPID PANEL
Cholesterol: 170 mg/dL (ref 0–200)
HDL: 72.4 mg/dL (ref >39.00)
LDL Cholesterol: 89 mg/dL (ref 0–99)
NonHDL: 98
Total CHOL/HDL Ratio: 2
Triglycerides: 43 mg/dL (ref 0.0–149.0)
VLDL: 8.6 mg/dL (ref 0.0–40.0)

## 2020-04-07 LAB — CBC
HCT: 37.5 % (ref 36.0–46.0)
Hemoglobin: 12.5 g/dL (ref 12.0–15.0)
MCHC: 33.5 g/dL (ref 30.0–36.0)
MCV: 85.6 fl (ref 78.0–100.0)
Platelets: 299 10*3/uL (ref 150.0–400.0)
RBC: 4.38 Mil/uL (ref 3.87–5.11)
RDW: 13.4 % (ref 11.5–15.5)
WBC: 6.8 10*3/uL (ref 4.0–10.5)

## 2020-04-07 LAB — VITAMIN D 25 HYDROXY (VIT D DEFICIENCY, FRACTURES): VITD: 19.44 ng/mL — ABNORMAL LOW (ref 30.00–100.00)

## 2020-04-07 LAB — MICROALBUMIN / CREATININE URINE RATIO
Creatinine,U: 58.4 mg/dL
Microalb Creat Ratio: 1.2 mg/g (ref 0.0–30.0)
Microalb, Ur: <0.7 mg/dL (ref 0.0–1.9)

## 2020-04-07 LAB — HEMOGLOBIN A1C: Hgb A1c MFr Bld: 8.5 % — ABNORMAL HIGH (ref 4.6–6.5)

## 2020-04-07 NOTE — Assessment & Plan Note (Signed)
A1C, CMET, Lipid and urine microalbumin today Encouraged low carb diet Encouraged routine foot exams Encouraged routine eye exams Continue Lantus and Humalog She declines immunizations at this time

## 2020-04-07 NOTE — Patient Instructions (Signed)
Health Maintenance, Female Adopting a healthy lifestyle and getting preventive care are important in promoting health and wellness. Ask your health care provider about:  The right schedule for you to have regular tests and exams.  Things you can do on your own to prevent diseases and keep yourself healthy. What should I know about diet, weight, and exercise? Eat a healthy diet   Eat a diet that includes plenty of vegetables, fruits, low-fat dairy products, and lean protein.  Do not eat a lot of foods that are high in solid fats, added sugars, or sodium. Maintain a healthy weight Body mass index (BMI) is used to identify weight problems. It estimates body fat based on height and weight. Your health care provider can help determine your BMI and help you achieve or maintain a healthy weight. Get regular exercise Get regular exercise. This is one of the most important things you can do for your health. Most adults should:  Exercise for at least 150 minutes each week. The exercise should increase your heart rate and make you sweat (moderate-intensity exercise).  Do strengthening exercises at least twice a week. This is in addition to the moderate-intensity exercise.  Spend less time sitting. Even light physical activity can be beneficial. Watch cholesterol and blood lipids Have your blood tested for lipids and cholesterol at 42 years of age, then have this test every 5 years. Have your cholesterol levels checked more often if:  Your lipid or cholesterol levels are high.  You are older than 42 years of age.  You are at high risk for heart disease. What should I know about cancer screening? Depending on your health history and family history, you may need to have cancer screening at various ages. This may include screening for:  Breast cancer.  Cervical cancer.  Colorectal cancer.  Skin cancer.  Lung cancer. What should I know about heart disease, diabetes, and high blood  pressure? Blood pressure and heart disease  High blood pressure causes heart disease and increases the risk of stroke. This is more likely to develop in people who have high blood pressure readings, are of African descent, or are overweight.  Have your blood pressure checked: ? Every 3-5 years if you are 18-39 years of age. ? Every year if you are 40 years old or older. Diabetes Have regular diabetes screenings. This checks your fasting blood sugar level. Have the screening done:  Once every three years after age 40 if you are at a normal weight and have a low risk for diabetes.  More often and at a younger age if you are overweight or have a high risk for diabetes. What should I know about preventing infection? Hepatitis B If you have a higher risk for hepatitis B, you should be screened for this virus. Talk with your health care provider to find out if you are at risk for hepatitis B infection. Hepatitis C Testing is recommended for:  Everyone born from 1945 through 1965.  Anyone with known risk factors for hepatitis C. Sexually transmitted infections (STIs)  Get screened for STIs, including gonorrhea and chlamydia, if: ? You are sexually active and are younger than 42 years of age. ? You are older than 42 years of age and your health care provider tells you that you are at risk for this type of infection. ? Your sexual activity has changed since you were last screened, and you are at increased risk for chlamydia or gonorrhea. Ask your health care provider if   you are at risk.  Ask your health care provider about whether you are at high risk for HIV. Your health care provider may recommend a prescription medicine to help prevent HIV infection. If you choose to take medicine to prevent HIV, you should first get tested for HIV. You should then be tested every 3 months for as long as you are taking the medicine. Pregnancy  If you are about to stop having your period (premenopausal) and  you may become pregnant, seek counseling before you get pregnant.  Take 400 to 800 micrograms (mcg) of folic acid every day if you become pregnant.  Ask for birth control (contraception) if you want to prevent pregnancy. Osteoporosis and menopause Osteoporosis is a disease in which the bones lose minerals and strength with aging. This can result in bone fractures. If you are 65 years old or older, or if you are at risk for osteoporosis and fractures, ask your health care provider if you should:  Be screened for bone loss.  Take a calcium or vitamin D supplement to lower your risk of fractures.  Be given hormone replacement therapy (HRT) to treat symptoms of menopause. Follow these instructions at home: Lifestyle  Do not use any products that contain nicotine or tobacco, such as cigarettes, e-cigarettes, and chewing tobacco. If you need help quitting, ask your health care provider.  Do not use street drugs.  Do not share needles.  Ask your health care provider for help if you need support or information about quitting drugs. Alcohol use  Do not drink alcohol if: ? Your health care provider tells you not to drink. ? You are pregnant, may be pregnant, or are planning to become pregnant.  If you drink alcohol: ? Limit how much you use to 0-1 drink a day. ? Limit intake if you are breastfeeding.  Be aware of how much alcohol is in your drink. In the [place] one drink equals one 12 oz bottle of beer (355 mL), one 5 oz glass of wine (148 mL), or one 1 oz glass of hard liquor (44 mL). General instructions  Schedule regular health, dental, and eye exams.  Stay current with your vaccines.  Tell your health care provider if: ? You often feel depressed. ? You have ever been abused or do not feel safe at home. Summary  Adopting a healthy lifestyle and getting preventive care are important in promoting health and wellness.  Follow your health care provider's instructions about healthy  diet, exercising, and getting tested or screened for diseases.  Follow your health care provider's instructions on monitoring your cholesterol and blood pressure. This information is not intended to replace advice given to you by your health care provider. Make sure you discuss any questions you have with your health care provider. Document Revised: 06/06/2018 Document Reviewed: 06/06/2018 Elsevier Patient Education  2020 [employer]  

## 2020-04-07 NOTE — Progress Notes (Signed)
Subjective:    Patient ID: Ellen [name], female    DOB: 12-10-1975, 44 y.o.   MRN: [id number]  HPI  Patient presents the clinic today for her annual exam.  She is also due to follow-up chronic conditions.  DM 1: Her last A1c was 9/9%, 06/2019.  She is taking Lantus and Humalog as prescribed.  Her sugars range 52-405.  She checks her feet routinely.  She is not following with endocrinology.  GERD: She is no longer taking Omeprazole. There is no upper GI on file.   Flu: never Tetanus: unsure Covid: never Pneumovax: never Pap smear: 02/2020 Mammogram: never Vision screening: annually Dentist: annually  Diet: She does eat meat. She consumes fruits and veggies daily. She does eat some fried foods. She drinks mostly water, unsweet tea, milk and [place] [employer]. Exercise: Walking  Review of Systems      Past Medical History:  Diagnosis Date  . DM (diabetes mellitus), type 1 ([place])   . Frequent headaches   . Hyperlipidemia     Current Outpatient Medications  Medication Sig Dispense Refill  . Continuous Blood Gluc Sensor (FREESTYLE LIBRE 2 SENSOR) MISC 1 each by Does not apply route every 14 (fourteen) days. 3 each 1  . glucagon (GLUCAGON EMERGENCY) 1 MG injection Inject 1 mg into the muscle once as needed. 2 each 12  . insulin lispro (HUMALOG KWIKPEN) 100 UNIT/ML KwikPen INJECT UP TO 50 UNITS UNDER THE SKIN DAILY AS DIRECTED 15 mL 3  . LANTUS SOLOSTAR 100 UNIT/ML Solostar Pen INJECT 15 UNITS UNDER THE SKIN 2 TIMES DAILY OR INCREASE AS DIRECTED (1 BOX LASTS 30 DAYS) 15 mL 0  . omeprazole (PRILOSEC) 40 MG capsule TAKE 1 CAPSULE BY MOUTH ONCE DAILY FOR THREE WEEKS THEN AS NEEDED 30 capsule 1  . Vitamin D, Ergocalciferol, (DRISDOL) 1.25 MG (50000 UT) CAPS capsule Take 1 capsule (50,000 Units total) by mouth every 7 (seven) days. 12 capsule 0   No current facility-administered medications for this visit.    No Known Allergies  Family History  Problem Relation Age of Onset  .  Hyperlipidemia Mother   . Cancer Father        prostate  . Arthritis Maternal Grandmother   . Hyperlipidemia Maternal Grandmother   . Hypertension Maternal Grandmother   . Stroke Maternal Grandfather   . Hypertension Maternal Grandfather   . Stroke Paternal Grandmother   . Diabetes Paternal Grandmother     Social History   Socioeconomic History  . Marital status: Married    Spouse name: Not on file  . Number of children: Not on file  . Years of education: Not on file  . Highest education level: Not on file  Occupational History  . Not on file  Tobacco Use  . Smoking status: Never Smoker  . Smokeless tobacco: Never Used  Substance and Sexual Activity  . Alcohol use: No  . Drug use: No  . Sexual activity: Yes  Other Topics Concern  . Not on file  Social History Narrative  . Not on file   Social Determinants of Health   Financial Resource Strain:   . Difficulty of Paying Living Expenses: Not on file  Food Insecurity:   . Worried About Charity fundraiser in the Last Year: Not on file  . Ran Out of Food in the Last Year: Not on file  Transportation Needs:   . Lack of Transportation (Medical): Not on file  . Lack of Transportation (Non-Medical):  Not on file  Physical Activity:   . Days of Exercise per Week: Not on file  . Minutes of Exercise per Session: Not on file  Stress:   . Feeling of Stress : Not on file  Social Connections:   . Frequency of Communication with Friends and Family: Not on file  . Frequency of Social Gatherings with Friends and Family: Not on file  . Attends Religious Services: Not on file  . Active Member of Clubs or Organizations: Not on file  . Attends Archivist Meetings: Not on file  . Marital Status: Not on file  Intimate Partner Violence:   . Fear of Current or Ex-Partner: Not on file  . Emotionally Abused: Not on file  . Physically Abused: Not on file  . Sexually Abused: Not on file     Constitutional: Denies fever,  malaise, fatigue, headache or abrupt weight changes.  HEENT: Denies eye pain, eye redness, ear pain, ringing in the ears, wax buildup, runny nose, nasal congestion, bloody nose, or sore throat. Respiratory: Denies difficulty breathing, shortness of breath, cough or sputum production.   Cardiovascular: Denies chest pain, chest tightness, palpitations or swelling in the hands or feet.  Gastrointestinal: Denies abdominal pain, bloating, constipation, diarrhea or blood in the stool.  GU: Denies urgency, frequency, pain with urination, burning sensation, blood in urine, odor or discharge. Musculoskeletal: Pt reports bilateral knee and foot pain. Denies decrease in range of motion, difficulty with gait, muscle pain or joint swelling.  Skin: Denies redness, rashes, lesions or ulcercations.  Neurological: Denies dizziness, difficulty with memory, difficulty with speech or problems with balance and coordination.  Psych: Denies anxiety, depression, SI/HI.  No other specific complaints in a complete review of systems (except as listed in HPI above).  Objective:   Physical Exam   BP 116/70   Pulse 78   Temp 98.1 F (36.7 C) (Temporal)   Ht $R'5\' 5"'GX$  (1.651 m)   Wt 186 lb (84.4 kg)   LMP 03/11/2020   SpO2 98%   BMI 30.95 kg/m   Wt Readings from Last 3 Encounters:  07/03/19 179 lb 1.6 oz (81.2 kg)  07/12/18 180 lb (81.6 kg)  06/25/18 183 lb (83 kg)    General: Appears her stated age, obese, in NAD. Skin: Warm, dry and intact. No ulcerations noted. HEENT: Head: normal shape and size; Eyes: sclera white, no icterus, conjunctiva pink, PERRLA and EOMs intact;  Neck:  Neck supple, trachea midline. No masses, lumps or thyromegaly present.  Cardiovascular: Normal rate and rhythm. S1,S2 noted.  No murmur, rubs or gallops noted. No JVD or BLE edema. No carotid bruits noted. Pulmonary/Chest: Normal effort and positive vesicular breath sounds. No respiratory distress. No wheezes, rales or ronchi noted.    Abdomen: Soft and nontender. Normal bowel sounds. No distention or masses noted. Liver, spleen and kidneys non palpable. Musculoskeletal: Strength 5/5 BUE/BLE. No signs of joint swelling. No difficulty with gait.  Neurological: Alert and oriented. Cranial nerves II-XII grossly intact. Coordination normal.  Psychiatric: Mood and affect normal. Behavior is normal. Judgment and thought content normal.    BMET    Component Value Date/Time   NA 134 (L) 07/03/2019 1608   NA 140 03/21/2013 0000   K 4.7 07/03/2019 1608   CL 99 07/03/2019 1608   CO2 30 07/03/2019 1608   GLUCOSE 352 (H) 07/03/2019 1608   BUN 18 07/03/2019 1608   BUN 14 03/21/2013 0000   CREATININE 0.74 07/03/2019 1608   CALCIUM  9.7 07/03/2019 1608   GFRNONAA >60 10/18/2007 1053   GFRAA  10/18/2007 1053    >60        The eGFR has been calculated using the MDRD equation. This calculation has not been validated in all clinical    Lipid Panel     Component Value Date/Time   CHOL 169 10/17/2018 1442   TRIG 64.0 10/17/2018 1442   HDL 63.90 10/17/2018 1442   CHOLHDL 3 10/17/2018 1442   VLDL 12.8 10/17/2018 1442   LDLCALC 93 10/17/2018 1442    CBC    Component Value Date/Time   WBC 11.4 (H) 07/03/2019 1608   RBC 4.62 07/03/2019 1608   HGB 12.9 07/03/2019 1608   HCT 39.7 07/03/2019 1608   PLT 305.0 07/03/2019 1608   MCV 86.0 07/03/2019 1608   MCHC 32.4 07/03/2019 1608   RDW 13.1 07/03/2019 1608   LYMPHSABS 1.4 07/03/2019 1608   MONOABS 0.6 07/03/2019 1608   EOSABS 1.2 (H) 07/03/2019 1608   BASOSABS 0.1 07/03/2019 1608    Hgb A1C Lab Results  Component Value Date   HGBA1C 9.9 (H) 07/05/2019           Assessment & Plan:   Preventative Health Maintenance:  She declines flu shot She declines tetanus shot She declines Covid vaccine She declines pneumovax Pap smear UTD- will request copy Mammograms will start at 48 Advised her to consume a balanced diet exercise regimen Advised her to see an eye  doctor and dentist annually We will check CBC, C met, lipid, A1c, urine microalbumin and vitamin D today  Bilateral Chronic Knee Pain:  Xray bilateral knees today  RTC in 1 year, sooner if needed Webb Silversmith, NP  This visit occurred during the SARS-CoV-2 public health emergency.  Safety protocols were in place, including screening questions prior to the visit, additional usage of staff PPE, and extensive cleaning of exam room while observing appropriate contact time as indicated for disinfecting solutions.

## 2020-05-04 ENCOUNTER — Other Ambulatory Visit: Payer: Self-pay | Admitting: Internal Medicine

## 2020-05-04 DIAGNOSIS — E1065 Type 1 diabetes mellitus with hyperglycemia: Secondary | ICD-10-CM

## 2020-05-06 ENCOUNTER — Encounter: Payer: Self-pay | Admitting: Internal Medicine

## 2020-05-07 ENCOUNTER — Encounter: Payer: Self-pay | Admitting: Family Medicine

## 2020-05-07 ENCOUNTER — Other Ambulatory Visit: Payer: Self-pay

## 2020-05-07 ENCOUNTER — Ambulatory Visit: Payer: 59 | Admitting: Family Medicine

## 2020-05-07 VITALS — BP 100/62 | HR 75 | Temp 98.0°F | Wt 183.5 lb

## 2020-05-07 DIAGNOSIS — E1065 Type 1 diabetes mellitus with hyperglycemia: Secondary | ICD-10-CM | POA: Diagnosis not present

## 2020-05-07 DIAGNOSIS — IMO0002 Reserved for concepts with insufficient information to code with codable children: Secondary | ICD-10-CM

## 2020-05-07 DIAGNOSIS — R3915 Urgency of urination: Secondary | ICD-10-CM

## 2020-05-07 LAB — POC URINALSYSI DIPSTICK (AUTOMATED)
Bilirubin, UA: NEGATIVE
Glucose, UA: POSITIVE — AB
Leukocytes, UA: NEGATIVE
Nitrite, UA: NEGATIVE
Protein, UA: NEGATIVE
Spec Grav, UA: >=1.03 — AB (ref 1.010–1.025)
Urobilinogen, UA: 0.2 E.U./dL
pH, UA: 6 (ref 5.0–8.0)

## 2020-05-07 MED ORDER — SULFAMETHOXAZOLE-TRIMETHOPRIM 800-160 MG PO TABS: 1.0000 | ORAL_TABLET | Freq: Two times a day (BID) | ORAL | 0 refills | Status: AC

## 2020-05-07 NOTE — Progress Notes (Signed)
   Subjective:     Ellen [name] is a 44 y.o. female presenting for Urinary Urgency (x 24 hours with an odor )     HPI  #urinary urgency - urgency - no dysuria - endorses some discomfort with urination - on her period - feels the heaviness  - hx of UTIs  - small volume urine - endorses some nocturia  - odor to urine   Review of Systems  Constitutional: Negative for chills and fever.  Gastrointestinal: Positive for nausea. Negative for abdominal pain and vomiting.     Social History   Tobacco Use  Smoking Status Never Smoker  Smokeless Tobacco Never Used        Objective:    BP Readings from Last 3 Encounters:  05/07/20 100/62  04/07/20 116/70  07/03/19 118/76   Wt Readings from Last 3 Encounters:  05/07/20 183 lb 8 oz (83.2 kg)  04/07/20 186 lb (84.4 kg)  07/03/19 179 lb 1.6 oz (81.2 kg)    BP 100/62   Pulse 75   Temp 98 F (36.7 C) (Temporal)   Wt 183 lb 8 oz (83.2 kg)   LMP 05/07/2020 (Exact Date)   SpO2 100%   BMI 30.54 kg/m    Physical Exam Constitutional:      General: She is not in acute distress.    Appearance: She is well-developed. She is not diaphoretic.  HENT:     Head: Normocephalic and atraumatic.  Eyes:     Conjunctiva/sclera: Conjunctivae normal.  Cardiovascular:     Rate and Rhythm: Normal rate and regular rhythm.     Heart sounds: Normal heart sounds.  Pulmonary:     Effort: Pulmonary effort is normal.  Abdominal:     General: Bowel sounds are normal. There is no distension.     Palpations: Abdomen is soft.     Tenderness: There is no abdominal tenderness. There is no guarding.  Musculoskeletal:     Cervical back: Neck supple.  Skin:    General: Skin is warm and dry.  Neurological:     Mental Status: She is alert.           Assessment & Plan:   Problem List Items Addressed This Visit      Endocrine   Diabetes mellitus type 1, uncontrolled, insulin dependent (HCC)    Pt notes glucose 250 and glucose in  her urine this morning. She has already taken insulin. Discussed this could be contributing to her symptoms and she will continue to work on glucose control. Cont Lantus and lispro        Other   Urgency of urination - Primary    Hx of e coli UTIs. Today UA normal, but will send for culture given pt concern. Reviewed bladder irritants. Abx sent to hold if culture comes positive. She will hydrate and take OTC medications prn.       Relevant Medications   sulfamethoxazole-trimethoprim (BACTRIM DS) 800-160 MG tablet   Other Relevant Orders   POCT Urinalysis Dipstick (Automated) (Completed)   Urine Culture       Return if symptoms worsen or fail to improve.  Lynnda Child, MD  This visit occurred during the SARS-CoV-2 public health emergency.  Safety protocols were in place, including screening questions prior to the visit, additional usage of staff PPE, and extensive cleaning of exam room while observing appropriate contact time as indicated for disinfecting solutions.

## 2020-05-07 NOTE — Assessment & Plan Note (Signed)
Hx of e coli UTIs. Today UA normal, but will send for culture given pt concern. Reviewed bladder irritants. Abx sent to hold if culture comes positive. She will hydrate and take OTC medications prn.

## 2020-05-07 NOTE — Patient Instructions (Signed)
Urine did not look infected, but will send off culture  Wait for results before starting antibiotics  Hydration and cranberry

## 2020-05-07 NOTE — Assessment & Plan Note (Addendum)
Pt notes glucose 250 and glucose in her urine this morning. She has already taken insulin. Discussed this could be contributing to her symptoms and she will continue to work on glucose control. Cont Lantus and lispro

## 2020-05-08 LAB — URINE CULTURE
MICRO NUMBER:: 11191572
SPECIMEN QUALITY:: ADEQUATE

## 2020-05-30 ENCOUNTER — Other Ambulatory Visit: Payer: Self-pay | Admitting: Internal Medicine

## 2020-05-30 DIAGNOSIS — E1065 Type 1 diabetes mellitus with hyperglycemia: Secondary | ICD-10-CM

## 2020-06-24 ENCOUNTER — Other Ambulatory Visit: Payer: Self-pay | Admitting: Internal Medicine

## 2020-06-24 DIAGNOSIS — E1065 Type 1 diabetes mellitus with hyperglycemia: Secondary | ICD-10-CM

## 2020-06-29 ENCOUNTER — Telehealth: Payer: Self-pay

## 2020-06-29 NOTE — Telephone Encounter (Signed)
She can schedule an appt if she would like to be seen/tested. Otherwise, Ibuprofen and salt water gargles.

## 2020-06-29 NOTE — Telephone Encounter (Signed)
[facility] Night - Client TELEPHONE ADVICE RECORD [facility] Patient Name: Ellen [name] Gender: Female DOB: April 12, 1976 Age: 45 Y 10 M 29 D Return Phone Number: [phone] (Primary) Address: City/State/Zip: [place] [address] Client [facility] Night - Client Client Site [facility] - Night Physician Nicki Reaper - NP Contact Type Call Who Is Calling Patient / Member / Family / Caregiver Call Type Triage / Clinical Relationship To Patient Self Return Phone Number [phone] (Primary) Chief Complaint Headache Reason for Call Symptomatic / Request for Health Information Initial Comment caller states they may have strep throat. she has sore throat, swollen lymph nodes, and headache. She did an at home covid test that was negative. Translation No Nurse Assessment Nurse: Laural Benes, RN, Dondra Spry Date/Time Lamount Cohen Time): 06/26/2020 8:17:56 AM Confirm and document reason for call. If symptomatic, describe symptoms. ---Ellen [name] states she developed pain in her throat with headache yesterday and during the night, the sore throat is worse along with swollen lymph nodes and the headache. Does the patient have any new or worsening symptoms? ---Yes Will a triage be completed? ---Yes Related visit to physician within the last 2 weeks? ---No Does the PT have any chronic conditions? (i.e. diabetes, asthma, this includes High risk factors for pregnancy, etc.) ---Yes List chronic conditions. ---Diabetes 1 Is the patient pregnant or possibly pregnant? (Ask all females between the ages of 38-55) ---No Is this a behavioral health or substance abuse call? ---No Guidelines Guideline Title Affirmed Question Affirmed Notes Nurse Date/Time Lamount Cohen Time) Sore Throat SEVERE (e.g., excruciating) throat pain Liberty Handy 06/26/2020 8:20:51 AM Disp. Time Lamount Cohen Time) Disposition Final User 06/26/2020 8:25:48 AM See PCP within 24  Hours Yes Laural Benes, RN, Suzi Roots Disagree/Comply Comply PLEASE NOTE: All timestamps contained within this report are represented as [place] Standard Time. CONFIDENTIALTY NOTICE: This fax transmission is intended only for the addressee. It contains information that is legally privileged, confidential or otherwise protected from use or disclosure. If you are not the intended recipient, you are strictly prohibited from reviewing, disclosing, copying using or disseminating any of this information or taking any action in reliance on or regarding this information. If you have received this fax in error, please notify us immediately by telephone so that we can arrange for its return to [place]. Phone: [phone], Toll-Free: [phone], Fax: [phone] Page: 2 of 2 Call Id: [id number] Caller Understands Yes PreDisposition Call Doctor Care Advice Given Per Guideline SEE PCP WITHIN 24 HOURS: * Here are some simple things you can do to treat and reduce sore throat pain. * Sip warm chicken broth or apple juice. * Suck on hard candy or an over-the-counter throat lozenge. * Gargle with warm salt water four times a day. To make salt water, put 1/2 teaspoon of salt in 8 oz (240 ml) of warm water. * Avoid cigarette smoke. * IBUPROFEN (E.G., MOTRIN, ADVIL): Take 400 mg (two 200 mg pills) by mouth every 6 hours. The most you should take each day is 1,200 mg (six 200 mg pills), unless your doctor has told you to take more. * Eat a soft diet. * Cold drinks, popsicles, and milk shakes are especially good. Avoid citrus fruits. * You become worse CARE ADVICE given per Sore Throat (Adult) guideline. Referrals GO TO FACILITY UNDECIDED  Called and spoke with patient for an update. Patient stated that she is doing better today. She went to the [facility] over the weekend where they ran  a covid and strep test, which were both negative. Patient stated that she has been taking Ibuprofen as needed. No fever  reported. Patient stated that if she isn't feeling better in a couple of days, she will call back to make an appointment. Informed patient that if she has new or worsening symptoms, to report to UC or ED. Patient verbalized understanding.

## 2020-07-01 NOTE — Telephone Encounter (Signed)
Pt went to [facility] 06/26/2020

## 2020-07-24 ENCOUNTER — Encounter: Payer: Self-pay | Admitting: Internal Medicine

## 2020-07-24 ENCOUNTER — Other Ambulatory Visit: Payer: Self-pay | Admitting: Internal Medicine

## 2020-07-24 DIAGNOSIS — E1065 Type 1 diabetes mellitus with hyperglycemia: Secondary | ICD-10-CM

## 2020-08-03 ENCOUNTER — Encounter: Payer: Self-pay | Admitting: Internal Medicine

## 2020-08-03 ENCOUNTER — Ambulatory Visit: Payer: 59 | Admitting: Internal Medicine

## 2020-08-12 ENCOUNTER — Other Ambulatory Visit: Payer: Self-pay

## 2020-08-12 ENCOUNTER — Telehealth: Payer: Self-pay | Admitting: Internal Medicine

## 2020-08-12 ENCOUNTER — Telehealth: Payer: Self-pay | Admitting: *Deleted

## 2020-08-12 DIAGNOSIS — E1065 Type 1 diabetes mellitus with hyperglycemia: Secondary | ICD-10-CM

## 2020-08-12 MED ORDER — LANTUS SOLOSTAR 100 UNIT/ML ~~LOC~~ SOPN: 15.0000 [IU] | PEN_INJECTOR | Freq: Two times a day (BID) | SUBCUTANEOUS | 0 refills | Status: DC

## 2020-08-12 NOTE — Telephone Encounter (Signed)
Rx sent through [facility]  

## 2020-08-12 NOTE — Telephone Encounter (Signed)
Pt called in need a refill on her lantus.

## 2020-08-12 NOTE — Telephone Encounter (Signed)
Ellen [name] pharmacist at [facility] left a voicemail stating that they need the patient's script for lantus solostar to be written so that it can be a 30 day supply for her insurance to cover it. Ellen [name] stated that it is written 15 ml inject 15 units under the skin twice a day. Ellen [name] want to know if the script can be changed to inject into the skin 15 units two times a day and can use up to 50 units each day. This will allow the p patient a 30 day supply.

## 2020-08-12 NOTE — Telephone Encounter (Cosign Needed)
Ok to send in as requested

## 2020-08-14 MED ORDER — LANTUS SOLOSTAR 100 UNIT/ML ~~LOC~~ SOPN: PEN_INJECTOR | SUBCUTANEOUS | 1 refills | Status: DC

## 2020-08-14 NOTE — Telephone Encounter (Signed)
Rx sent through [facility]  

## 2020-08-14 NOTE — Addendum Note (Signed)
Addended by: Roena Malady on: 08/14/2020 05:01 PM   Modules accepted: Orders

## 2020-09-01 ENCOUNTER — Encounter: Payer: Self-pay | Admitting: Internal Medicine

## 2020-09-01 ENCOUNTER — Ambulatory Visit: Payer: 59 | Admitting: Internal Medicine

## 2020-09-01 ENCOUNTER — Other Ambulatory Visit: Payer: Self-pay

## 2020-09-01 VITALS — BP 106/68 | HR 85 | Temp 97.8°F | Wt 188.0 lb

## 2020-09-01 DIAGNOSIS — E1065 Type 1 diabetes mellitus with hyperglycemia: Secondary | ICD-10-CM | POA: Diagnosis not present

## 2020-09-01 DIAGNOSIS — IMO0002 Reserved for concepts with insufficient information to code with codable children: Secondary | ICD-10-CM

## 2020-09-01 LAB — POCT GLYCOSYLATED HEMOGLOBIN (HGB A1C): Hemoglobin A1C: 8.6 % — AB (ref 4.0–5.6)

## 2020-09-01 MED ORDER — FREESTYLE LIBRE 2 SENSOR MISC: 1.0000 | 1 refills | Status: DC

## 2020-09-01 NOTE — Patient Instructions (Signed)
Diabetes Mellitus and Exercise Exercising regularly is important for overall health, especially for people who have diabetes mellitus. Exercising is not only about losing weight. It has many other health benefits, such as increasing muscle strength and bone density and reducing body fat and stress. This leads to improved fitness, flexibility, and endurance, all of which result in better overall health. What are the benefits of exercise if I have diabetes? Exercise has many benefits for people with diabetes. They include:  Helping to lower and control blood sugar (glucose).  Helping the body to respond better to the hormone insulin by improving insulin sensitivity.  Reducing how much insulin the body needs.  Lowering the risk for heart disease by: ? Lowering "bad" cholesterol and triglyceride levels. ? Increasing "good" cholesterol levels. ? Lowering blood pressure. ? Lowering blood glucose levels. What is my activity plan? Your health care provider or certified diabetes educator can help you make a plan for the type and frequency of exercise that works for you. This is called your activity plan. Be sure to:  Get at least 150 minutes of medium-intensity or high-intensity exercise each week. Exercises may include brisk walking, biking, or water aerobics.  Do stretching and strengthening exercises, such as yoga or weight lifting, at least 2 times a week.  Spread out your activity over at least 3 days of the week.  Get some form of physical activity each day. ? Do not go more than 2 days in a row without some kind of physical activity. ? Avoid being inactive for more than 90 minutes at a time. Take frequent breaks to walk or stretch.  Choose exercises or activities that you enjoy. Set realistic goals.  Start slowly and gradually increase your exercise intensity over time.   How do I manage my diabetes during exercise? Monitor your blood glucose  Check your blood glucose before and  after exercising. If your blood glucose is: ? 240 mg/dL (13.3 mmol/L) or higher before you exercise, check your urine for ketones. These are chemicals created by the liver. If you have ketones in your urine, do not exercise until your blood glucose returns to normal. ? 100 mg/dL (5.6 mmol/L) or lower, eat a snack containing 15-20 grams of carbohydrate. Check your blood glucose 15 minutes after the snack to make sure that your glucose level is above 100 mg/dL (5.6 mmol/L) before you start your exercise.  Know the symptoms of low blood glucose (hypoglycemia) and how to treat it. Your risk for hypoglycemia increases during and after exercise. Follow these tips and your health care provider's instructions  Keep a carbohydrate snack that is fast-acting for use before, during, and after exercise to help prevent or treat hypoglycemia.  Avoid injecting insulin into areas of the body that are going to be exercised. For example, avoid injecting insulin into: ? Your arms, when you are about to play tennis. ? Your legs, when you are about to go jogging.  Keep records of your exercise habits. Doing this can help you and your health care provider adjust your diabetes management plan as needed. Write down: ? Food that you eat before and after you exercise. ? Blood glucose levels before and after you exercise. ? The type and amount of exercise you have done.  Work with your health care provider when you start a new exercise or activity. He or she may need to: ? Make sure that the activity is safe for you. ? Adjust your insulin, other medicines, and food that   you eat.  Drink plenty of water while you exercise. This prevents loss of water (dehydration) and problems caused by a lot of heat in the body (heat stroke).   Where to find more information  American Diabetes Association: www.diabetes.org Summary  Exercising regularly is important for overall health, especially for people who have diabetes  mellitus.  Exercising has many health benefits. It increases muscle strength and bone density and reduces body fat and stress. It also lowers and controls blood glucose.  Your health care provider or certified diabetes educator can help you make an activity plan for the type and frequency of exercise that works for you.  Work with your health care provider to make sure any new activity is safe for you. Also work with your health care provider to adjust your insulin, other medicines, and the food you eat. This information is not intended to replace advice given to you by your health care provider. Make sure you discuss any questions you have with your health care provider. Document Revised: 03/11/2019 Document Reviewed: 03/11/2019 Elsevier Patient Education  2021 [employer]  

## 2020-09-01 NOTE — Progress Notes (Signed)
Subjective:    Patient ID: Ellen [name], female    DOB: 21-Oct-1975, 45 y.o.   MRN: [id number]  HPI  Patient presents to the clinic today for follow-up of DM 1.  Her last A1c was 8.5%, 03/2020.  She is currently taking 14 units of Lantus BID and Humulog per sliding scale.  Her sugars range 53-400.  She checks her feet routinely.  Her last eye exam was less than 1 year ago.  Flu never.  Pneumovax never.  Covid never.  Review of Systems      Past Medical History:  Diagnosis Date  . DM (diabetes mellitus), type 1 ([place])   . Frequent headaches   . Hyperlipidemia     Current Outpatient Medications  Medication Sig Dispense Refill  . Continuous Blood Gluc Sensor (FREESTYLE LIBRE 2 SENSOR) MISC 1 each by Does not apply route every 14 (fourteen) days. 3 each 1  . glucagon (GLUCAGON EMERGENCY) 1 MG injection Inject 1 mg into the muscle once as needed. 2 each 12  . insulin glargine (LANTUS SOLOSTAR) 100 UNIT/ML Solostar Pen Inject 15 units two times daily and can use up to 50 units each day 15 mL 1  . insulin lispro (HUMALOG KWIKPEN) 100 UNIT/ML KwikPen INJECT UP TO 50 UNITS UNDER THE SKIN DAILY AS DIRECTED 15 mL 3   No current facility-administered medications for this visit.    No Known Allergies  Family History  Problem Relation Age of Onset  . Hyperlipidemia Mother   . Cancer Father        prostate  . Arthritis Maternal Grandmother   . Hyperlipidemia Maternal Grandmother   . Hypertension Maternal Grandmother   . Stroke Maternal Grandfather   . Hypertension Maternal Grandfather   . Stroke Paternal Grandmother   . Diabetes Paternal Grandmother     Social History   Socioeconomic History  . Marital status: Married    Spouse name: Not on file  . Number of children: Not on file  . Years of education: Not on file  . Highest education level: Not on file  Occupational History  . Not on file  Tobacco Use  . Smoking status: Never Smoker  . Smokeless tobacco: Never Used   Substance and Sexual Activity  . Alcohol use: No  . Drug use: No  . Sexual activity: Yes  Other Topics Concern  . Not on file  Social History Narrative  . Not on file   Social Determinants of Health   Financial Resource Strain: Not on file  Food Insecurity: Not on file  Transportation Needs: Not on file  Physical Activity: Not on file  Stress: Not on file  Social Connections: Not on file  Intimate Partner Violence: Not on file     Constitutional: Denies fever, malaise, fatigue, headache or abrupt weight changes.  HEENT: Denies eye pain, eye redness, ear pain, ringing in the ears, wax buildup, runny nose, nasal congestion, bloody nose, or sore throat. Respiratory: Denies difficulty breathing, shortness of breath, cough or sputum production.   Cardiovascular: Denies chest pain, chest tightness, palpitations or swelling in the hands or feet.  Gastrointestinal: Denies abdominal pain, bloating, constipation, diarrhea or blood in the stool.  Skin: Denies redness, rashes, lesions or ulcercations.  Neurological: Denies dizziness, difficulty with memory, difficulty with speech or problems with balance and coordination.    No other specific complaints in a complete review of systems (except as listed in HPI above).  Objective:   Physical Exam   BP  106/68   Pulse 85   Temp 97.8 F (36.6 C) (Temporal)   Wt 188 lb (85.3 kg)   SpO2 98%   BMI 31.28 kg/m   Wt Readings from Last 3 Encounters:  09/01/20 188 lb (85.3 kg)  05/07/20 183 lb 8 oz (83.2 kg)  04/07/20 186 lb (84.4 kg)    General: Appears her tated age, obese, in NAD. Skin: Warm, dry and intact. No ulcerations noted. Cardiovascular: Normal rate. Pulmonary/Chest: Normal effort. Neurological: Alert and oriented.    BMET    Component Value Date/Time   NA 139 04/07/2020 1126   NA 140 03/21/2013 0000   K 4.4 04/07/2020 1126   CL 104 04/07/2020 1126   CO2 29 04/07/2020 1126   GLUCOSE 104 (H) 04/07/2020 1126   BUN  13 04/07/2020 1126   BUN 14 03/21/2013 0000   CREATININE 0.61 04/07/2020 1126   CALCIUM 8.9 04/07/2020 1126   GFRNONAA >60 10/18/2007 1053   GFRAA  10/18/2007 1053    >60        The eGFR has been calculated using the MDRD equation. This calculation has not been validated in all clinical    Lipid Panel     Component Value Date/Time   CHOL 170 04/07/2020 1126   TRIG 43.0 04/07/2020 1126   HDL 72.40 04/07/2020 1126   CHOLHDL 2 04/07/2020 1126   VLDL 8.6 04/07/2020 1126   LDLCALC 89 04/07/2020 1126    CBC    Component Value Date/Time   WBC 6.8 04/07/2020 1126   RBC 4.38 04/07/2020 1126   HGB 12.5 04/07/2020 1126   HCT 37.5 04/07/2020 1126   PLT 299.0 04/07/2020 1126   MCV 85.6 04/07/2020 1126   MCHC 33.5 04/07/2020 1126   RDW 13.4 04/07/2020 1126   LYMPHSABS 1.4 07/03/2019 1608   MONOABS 0.6 07/03/2019 1608   EOSABS 1.2 (H) 07/03/2019 1608   BASOSABS 0.1 07/03/2019 1608    Hgb A1C Lab Results  Component Value Date   HGBA1C 8.5 (H) 04/07/2020           Assessment & Plan:    Webb Silversmith, NP This visit occurred during the SARS-CoV-2 public health emergency.  Safety protocols were in place, including screening questions prior to the visit, additional usage of staff PPE, and extensive cleaning of exam room while observing appropriate contact time as indicated for disinfecting solutions.

## 2020-09-01 NOTE — Assessment & Plan Note (Signed)
A1C today 8.6 She will continue Lantus and Humalog-refilled She plans on cleaning up her diet, increasing exercise Encouraged her to get her diabetic eye exam Encouraged routine foot exams She declines flu, pneumovax and covid vaccine

## 2020-09-24 ENCOUNTER — Other Ambulatory Visit: Payer: Self-pay | Admitting: Internal Medicine

## 2020-09-24 DIAGNOSIS — E1065 Type 1 diabetes mellitus with hyperglycemia: Secondary | ICD-10-CM

## 2020-12-16 ENCOUNTER — Other Ambulatory Visit: Payer: Self-pay | Admitting: Internal Medicine

## 2020-12-16 DIAGNOSIS — E1065 Type 1 diabetes mellitus with hyperglycemia: Secondary | ICD-10-CM

## 2021-01-06 ENCOUNTER — Other Ambulatory Visit: Payer: Self-pay

## 2021-03-22 ENCOUNTER — Ambulatory Visit: Payer: 59 | Admitting: Internal Medicine

## 2021-03-22 ENCOUNTER — Encounter: Payer: Self-pay | Admitting: Internal Medicine

## 2021-03-22 ENCOUNTER — Telehealth: Payer: Self-pay | Admitting: Internal Medicine

## 2021-03-22 ENCOUNTER — Other Ambulatory Visit: Payer: Self-pay

## 2021-03-22 DIAGNOSIS — E6609 Other obesity due to excess calories: Secondary | ICD-10-CM | POA: Diagnosis not present

## 2021-03-22 DIAGNOSIS — Z6831 Body mass index (BMI) 31.0-31.9, adult: Secondary | ICD-10-CM | POA: Diagnosis not present

## 2021-03-22 DIAGNOSIS — E1065 Type 1 diabetes mellitus with hyperglycemia: Secondary | ICD-10-CM

## 2021-03-22 MED ORDER — INSULIN LISPRO (1 UNIT DIAL) 100 UNIT/ML (KWIKPEN): PEN_INJECTOR | SUBCUTANEOUS | 3 refills | Status: DC

## 2021-03-22 MED ORDER — LANTUS SOLOSTAR 100 UNIT/ML ~~LOC~~ SOPN: 14.0000 [IU] | PEN_INJECTOR | Freq: Two times a day (BID) | SUBCUTANEOUS | 3 refills | Status: DC

## 2021-03-22 NOTE — Telephone Encounter (Signed)
I called and spoke with Morrie Sheldon at [facility] and clarified the prescription that was sent over.

## 2021-03-22 NOTE — Assessment & Plan Note (Signed)
A1C, CMET and lipid profile today Encouraged her to consume a low carb diet and exercise for weight loss Continue Lantus and Humalog, refilled today Encouraged her to schedule an eye exam Encouraged routine foot exams She declines flu, pneumovax and covid vaccine

## 2021-03-22 NOTE — Patient Instructions (Signed)
Carbohydrate Counting for Diabetes Mellitus, Adult Carbohydrate counting is a method of keeping track of how many carbohydrates you eat. Eating carbohydrates naturally increases the amount of sugar (glucose) in the blood. Counting how many carbohydrates you eat improves your blood glucose control, which helps you manage your diabetes. It is important to know how many carbohydrates you can safely have in each meal. This is different for every person. A dietitian can help you make a meal plan and calculate how many carbohydrates you should have at each meal and snack. What foods contain carbohydrates? Carbohydrates are found in the following foods: Grains, such as breads and cereals. Dried beans and soy products. Starchy vegetables, such as potatoes, peas, and corn. Fruit and fruit juices. Milk and yogurt. Sweets and snack foods, such as cake, cookies, candy, chips, and soft drinks. How do I count carbohydrates in foods? There are two ways to count carbohydrates in food. You can read food labels or learn standard serving sizes of foods. You can use either of the methods or a combination of both. Using the Nutrition Facts label The Nutrition Facts list is included on the labels of almost all packaged foods and beverages in the [place] It includes: The serving size. Information about nutrients in each serving, including the grams (g) of carbohydrate per serving. To use the Nutrition Facts: Decide how many servings you will have. Multiply the number of servings by the number of carbohydrates per serving. The resulting number is the total amount of carbohydrates that you will be having. Learning the standard serving sizes of foods When you eat carbohydrate foods that are not packaged or do not include Nutrition Facts on the label, you need to measure the servings in order to count the amount of carbohydrates. Measure the foods that you will eat with a food scale or measuring cup, if needed. Decide how  many standard-size servings you will eat. Multiply the number of servings by 15. For foods that contain carbohydrates, one serving equals 15 g of carbohydrates. For example, if you eat 2 cups or 10 oz (300 g) of strawberries, you will have eaten 2 servings and 30 g of carbohydrates (2 servings x 15 g = 30 g). For foods that have more than one food mixed, such as soups and casseroles, you must count the carbohydrates in each food that is included. The following list contains standard serving sizes of common carbohydrate-rich foods. Each of these servings has about 15 g of carbohydrates: 1 slice of bread. 1 six-inch (15 cm) tortilla. ? cup or 2 oz (53 g) cooked rice or pasta.  cup or 3 oz (85 g) cooked or canned, drained and rinsed beans or lentils.  cup or 3 oz (85 g) starchy vegetable, such as peas, corn, or squash.  cup or 4 oz (120 g) hot cereal.  cup or 3 oz (85 g) boiled or mashed potatoes, or  or 3 oz (85 g) of a large baked potato.  cup or 4 fl oz (118 mL) fruit juice. 1 cup or 8 fl oz (237 mL) milk. 1 small or 4 oz (106 g) apple.  or 2 oz (63 g) of a medium banana. 1 cup or 5 oz (150 g) strawberries. 3 cups or 1 oz (24 g) popped popcorn. What is an example of carbohydrate counting? To calculate the number of carbohydrates in this sample meal, follow the steps shown below. Sample meal 3 oz (85 g) chicken breast. ? cup or 4 oz (106 g) brown   rice.  cup or 3 oz (85 g) corn. 1 cup or 8 fl oz (237 mL) milk. 1 cup or 5 oz (150 g) strawberries with sugar-free whipped topping. Carbohydrate calculation Identify the foods that contain carbohydrates: Rice. Corn. Milk. Strawberries. Calculate how many servings you have of each food: 2 servings rice. 1 serving corn. 1 serving milk. 1 serving strawberries. Multiply each number of servings by 15 g: 2 servings rice x 15 g = 30 g. 1 serving corn x 15 g = 15 g. 1 serving milk x 15 g = 15 g. 1 serving strawberries x 15 g = 15  g. Add together all of the amounts to find the total grams of carbohydrates eaten: 30 g + 15 g + 15 g + 15 g = 75 g of carbohydrates total. What are tips for following this plan? Shopping Develop a meal plan and then make a shopping list. Buy fresh and frozen vegetables, fresh and frozen fruit, dairy, eggs, beans, lentils, and whole grains. Look at food labels. Choose foods that have more fiber and less sugar. Avoid processed foods and foods with added sugars. Meal planning Aim to have the same amount of carbohydrates at each meal and for each snack time. Plan to have regular, balanced meals and snacks. Where to find more information American Diabetes Association: www.diabetes.org Centers for Disease Control and Prevention: www.cdc.gov Summary Carbohydrate counting is a method of keeping track of how many carbohydrates you eat. Eating carbohydrates naturally increases the amount of sugar (glucose) in the blood. Counting how many carbohydrates you eat improves your blood glucose control, which helps you manage your diabetes. A dietitian can help you make a meal plan and calculate how many carbohydrates you should have at each meal and snack. This information is not intended to replace advice given to you by your health care provider. Make sure you discuss any questions you have with your health care provider. Document Revised: 06/13/2019 Document Reviewed: 06/14/2019 Elsevier Patient Education  2021 Elsevier Inc.  

## 2021-03-22 NOTE — Assessment & Plan Note (Signed)
Encouraged diet and exercise for weight loss ?

## 2021-03-22 NOTE — Telephone Encounter (Signed)
Ellen [name] is calling from [employer] calling to clairfy the sig oninsulin glargine (LANTUS SOLOSTAR) 100 UNIT/ML Solostar Pen [[id number]] there are two . Please advise CB- [phone]

## 2021-03-22 NOTE — Progress Notes (Signed)
 Subjective:    Patient ID: Ellen [name], female    DOB: 03/22/1976, 45 y.o.   MRN: [id number]  HPI  Patient presents the clinic today for follow-up of chronic conditions.  DM1: Her last A1c was 8.6%, 08/2020. She is taking Lantus and Humalog as prescribed.  Her sugars range 52- > 400.  She checks her feet routinely.  Her last eye exam was more than 1 year.  Flu 04/2013.  Pneumovax never.  COVID never.  Review of Systems  Past Medical History:  Diagnosis Date   DM (diabetes mellitus), type 1 ([facility])    Frequent headaches    Hyperlipidemia     Current Outpatient Medications  Medication Sig Dispense Refill   Continuous Blood Gluc Sensor (FREESTYLE LIBRE 2 SENSOR) MISC 1 each by Does not apply route every 14 (fourteen) days. 6 each 1   glucagon (GLUCAGON EMERGENCY) 1 MG injection Inject 1 mg into the muscle once as needed. 2 each 12   insulin glargine (LANTUS SOLOSTAR) 100 UNIT/ML Solostar Pen INJECT 15 UNITS TWICE DAILY AND CAN USE UP TO 50 UNITS DAILY 15 mL 1   insulin lispro (HUMALOG KWIKPEN) 100 UNIT/ML KwikPen INJECT UP TO 50 UNITS UNDER THE SKIN DAILY AS DIRECTED 15 mL 3   No current facility-administered medications for this visit.    No Known Allergies  Family History  Problem Relation Age of Onset   Hyperlipidemia Mother    Cancer Father        prostate   Arthritis Maternal Grandmother    Hyperlipidemia Maternal Grandmother    Hypertension Maternal Grandmother    Stroke Maternal Grandfather    Hypertension Maternal Grandfather    Stroke Paternal Grandmother    Diabetes Paternal Grandmother     Social History   Socioeconomic History   Marital status: Married    Spouse name: Not on file   Number of children: Not on file   Years of education: Not on file   Highest education level: Not on file  Occupational History   Not on file  Tobacco Use   Smoking status: Never   Smokeless tobacco: Never  Substance and Sexual Activity   Alcohol use: No   Drug use: No    Sexual activity: Yes  Other Topics Concern   Not on file  Social History Narrative   Not on file   Social Determinants of Health   Financial Resource Strain: Not on file  Food Insecurity: Not on file  Transportation Needs: Not on file  Physical Activity: Not on file  Stress: Not on file  Social Connections: Not on file  Intimate Partner Violence: Not on file     Constitutional: Denies fever, malaise, fatigue, headache or abrupt weight changes.  HEENT: Pt reports vision changes. Denies eye pain, eye redness, ear pain, ringing in the ears, wax buildup, runny nose, nasal congestion, bloody nose, or sore throat. Respiratory: Denies difficulty breathing, shortness of breath, cough or sputum production.   Cardiovascular: Denies chest pain, chest tightness, palpitations or swelling in the hands or feet.  Gastrointestinal: Denies abdominal pain, bloating, constipation, diarrhea or blood in the stool.  GU: Denies urgency, frequency, pain with urination, burning sensation, blood in urine, odor or discharge. Musculoskeletal: Denies decrease in range of motion, difficulty with gait, muscle pain or joint pain and swelling.  Skin: Denies redness, rashes, lesions or ulcercations.  Neurological: Denies dizziness, difficulty with memory, difficulty with speech or problems with balance and coordination.  Psych: Denies anxiety, depression,   SI/HI.  No other specific complaints in a complete review of systems (except as listed in HPI above).     Objective:   Physical Exam   BP 101/61 (BP Location: Right Arm, Patient Position: Sitting, Cuff Size: Large)   Pulse 79   Temp (!) 97.1 F (36.2 C) (Temporal)   Resp 17   Ht 5' 5" (1.651 m)   Wt 187 lb 6.4 oz (85 kg)   SpO2 100%   BMI 31.18 kg/m   Wt Readings from Last 3 Encounters:  09/01/20 188 lb (85.3 kg)  05/07/20 183 lb 8 oz (83.2 kg)  04/07/20 186 lb (84.4 kg)    General: Appears her stated age,obese, in NAD. Skin: Warm, dry and  intact. No ulcerations noted. HEENT: Head: normal shape and size; Eyes: sclera white and EOMs intact;  Cardiovascular: Normal rate and rhythm. S1,S2 noted.  No murmur, rubs or gallops noted. No JVD or BLE edema.  Pulmonary/Chest: Normal effort and positive vesicular breath sounds. No respiratory distress. No wheezes, rales or ronchi noted.  Musculoskeletal: No difficulty with gait.  Neurological: Alert and oriented. Cranial nerves II-XII grossly intact. Coordination normal.  Psychiatric: Mood and affect normal. Behavior is normal. Judgment and thought content normal.    BMET    Component Value Date/Time   NA 139 04/07/2020 1126   NA 140 03/21/2013 0000   K 4.4 04/07/2020 1126   CL 104 04/07/2020 1126   CO2 29 04/07/2020 1126   GLUCOSE 104 (H) 04/07/2020 1126   BUN 13 04/07/2020 1126   BUN 14 03/21/2013 0000   CREATININE 0.61 04/07/2020 1126   CALCIUM 8.9 04/07/2020 1126   GFRNONAA >60 10/18/2007 1053   GFRAA  10/18/2007 1053    >60        The eGFR has been calculated using the MDRD equation. This calculation has not been validated in all clinical    Lipid Panel     Component Value Date/Time   CHOL 170 04/07/2020 1126   TRIG 43.0 04/07/2020 1126   HDL 72.40 04/07/2020 1126   CHOLHDL 2 04/07/2020 1126   VLDL 8.6 04/07/2020 1126   LDLCALC 89 04/07/2020 1126    CBC    Component Value Date/Time   WBC 6.8 04/07/2020 1126   RBC 4.38 04/07/2020 1126   HGB 12.5 04/07/2020 1126   HCT 37.5 04/07/2020 1126   PLT 299.0 04/07/2020 1126   MCV 85.6 04/07/2020 1126   MCHC 33.5 04/07/2020 1126   RDW 13.4 04/07/2020 1126   LYMPHSABS 1.4 07/03/2019 1608   MONOABS 0.6 07/03/2019 1608   EOSABS 1.2 (H) 07/03/2019 1608   BASOSABS 0.1 07/03/2019 1608    Hgb A1C Lab Results  Component Value Date   HGBA1C 8.6 (A) 09/01/2020           Assessment & Plan:     Webb Silversmith, NP This visit occurred during the SARS-CoV-2 public health emergency.  Safety protocols were in  place, including screening questions prior to the visit, additional usage of staff PPE, and extensive cleaning of exam room while observing appropriate contact time as indicated for disinfecting solutions.

## 2021-03-23 LAB — HEMOGLOBIN A1C
Hgb A1c MFr Bld: 8.7 % of total Hgb — ABNORMAL HIGH (ref <5.7)
Mean Plasma Glucose: 203 mg/dL
eAG (mmol/L): 11.2 mmol/L

## 2021-03-23 LAB — COMPLETE METABOLIC PANEL WITH GFR
AG Ratio: 1.4 (calc) (ref 1.0–2.5)
ALT: 10 U/L (ref 6–29)
AST: 11 U/L (ref 10–30)
Albumin: 3.8 g/dL (ref 3.6–5.1)
Alkaline phosphatase (APISO): 66 U/L (ref 31–125)
BUN: 13 mg/dL (ref 7–25)
CO2: 27 mmol/L (ref 20–32)
Calcium: 9 mg/dL (ref 8.6–10.2)
Chloride: 105 mmol/L (ref 98–110)
Creat: 0.59 mg/dL (ref 0.50–0.99)
Globulin: 2.8 g/dL (calc) (ref 1.9–3.7)
Glucose, Bld: 195 mg/dL — ABNORMAL HIGH (ref 65–99)
Potassium: 4.7 mmol/L (ref 3.5–5.3)
Sodium: 138 mmol/L (ref 135–146)
Total Bilirubin: 0.4 mg/dL (ref 0.2–1.2)
Total Protein: 6.6 g/dL (ref 6.1–8.1)
eGFR: 115 mL/min/{1.73_m2} (ref >=60)

## 2021-03-23 LAB — LIPID PANEL
Cholesterol: 167 mg/dL (ref <200)
HDL: 68 mg/dL (ref >=50)
LDL Cholesterol (Calc): 86 mg/dL (calc)
Non-HDL Cholesterol (Calc): 99 mg/dL (calc) (ref <130)
Total CHOL/HDL Ratio: 2.5 (calc) (ref <5.0)
Triglycerides: 45 mg/dL (ref <150)

## 2021-12-27 ENCOUNTER — Encounter: Payer: Self-pay | Admitting: Internal Medicine

## 2021-12-27 ENCOUNTER — Ambulatory Visit: Payer: Self-pay | Admitting: *Deleted

## 2021-12-27 ENCOUNTER — Ambulatory Visit: Payer: 59 | Admitting: Internal Medicine

## 2021-12-27 VITALS — BP 116/84 | HR 96 | Temp 98.0°F | Ht 66.0 in | Wt 190.0 lb

## 2021-12-27 DIAGNOSIS — Z1159 Encounter for screening for other viral diseases: Secondary | ICD-10-CM | POA: Diagnosis not present

## 2021-12-27 DIAGNOSIS — E6609 Other obesity due to excess calories: Secondary | ICD-10-CM | POA: Diagnosis not present

## 2021-12-27 DIAGNOSIS — E1065 Type 1 diabetes mellitus with hyperglycemia: Secondary | ICD-10-CM

## 2021-12-27 DIAGNOSIS — Z0001 Encounter for general adult medical examination with abnormal findings: Secondary | ICD-10-CM | POA: Diagnosis not present

## 2021-12-27 DIAGNOSIS — Z6830 Body mass index (BMI) 30.0-30.9, adult: Secondary | ICD-10-CM

## 2021-12-27 MED ORDER — INSULIN LISPRO (1 UNIT DIAL) 100 UNIT/ML (KWIKPEN): PEN_INJECTOR | SUBCUTANEOUS | 1 refills | Status: DC

## 2021-12-27 MED ORDER — LANTUS SOLOSTAR 100 UNIT/ML ~~LOC~~ SOPN: 14.0000 [IU] | PEN_INJECTOR | Freq: Two times a day (BID) | SUBCUTANEOUS | 1 refills | Status: DC

## 2021-12-27 NOTE — Progress Notes (Signed)
Subjective:    Patient ID: Ellen [name], female    DOB: January 01, 1976, 46 y.o.   MRN: [id number]  HPI  Patient presents to clinic today for her annual exam.  Flu: never Tetanus: unsure COVID: never Pneumovax: never Pap smear: 2021, [facility] Mammogram: 2021 Vision screening: annually, [facility]: biannually  Diet: She does eat meat. She consumes fruits and veggies. She does eat some fried foods. She drinks [place], water. Exercise: None  Review of Systems  Past Medical History:  Diagnosis Date   DM (diabetes mellitus), type 1 ([facility])    Frequent headaches    Hyperlipidemia     Current Outpatient Medications  Medication Sig Dispense Refill   cetirizine (ZYRTEC) 10 MG tablet Take 10 mg by mouth daily.     Continuous Blood Gluc Sensor (FREESTYLE LIBRE 2 SENSOR) MISC 1 each by Does not apply route every 14 (fourteen) days. 6 each 1   glucagon (GLUCAGON EMERGENCY) 1 MG injection Inject 1 mg into the muscle once as needed. 2 each 12   insulin glargine (LANTUS SOLOSTAR) 100 UNIT/ML Solostar Pen Inject 14 Units into the skin 2 (two) times daily. INJECT 15 UNITS TWICE DAILY AND CAN USE UP TO 50 UNITS DAILY 15 mL 3   insulin lispro (HUMALOG KWIKPEN) 100 UNIT/ML KwikPen INJECT UP TO 50 UNITS UNDER THE SKIN DAILY AS DIRECTED 15 mL 3   No current facility-administered medications for this visit.    No Known Allergies  Family History  Problem Relation Age of Onset   Hyperlipidemia Mother    Cancer Father        prostate   Arthritis Maternal Grandmother    Hyperlipidemia Maternal Grandmother    Hypertension Maternal Grandmother    Stroke Maternal Grandfather    Hypertension Maternal Grandfather    Stroke Paternal Grandmother    Diabetes Paternal Grandmother     Social History   Socioeconomic History   Marital status: Married    Spouse name: Not on file   Number of children: Not on file   Years of education: Not on file   Highest education level:  Not on file  Occupational History   Not on file  Tobacco Use   Smoking status: Never   Smokeless tobacco: Never  Vaping Use   Vaping Use: Never used  Substance and Sexual Activity   Alcohol use: No   Drug use: No   Sexual activity: Yes  Other Topics Concern   Not on file  Social History Narrative   Not on file   Social Determinants of Health   Financial Resource Strain: Not on file  Food Insecurity: Not on file  Transportation Needs: Not on file  Physical Activity: Not on file  Stress: Not on file  Social Connections: Not on file  Intimate Partner Violence: Not on file     Constitutional: Denies fever, malaise, fatigue, headache or abrupt weight changes.  HEENT: Denies eye pain, eye redness, ear pain, ringing in the ears, wax buildup, runny nose, nasal congestion, bloody nose, or sore throat. Respiratory: Denies difficulty breathing, shortness of breath, cough or sputum production.   Cardiovascular: Denies chest pain, chest tightness, palpitations or swelling in the hands or feet.  Gastrointestinal: Denies abdominal pain, bloating, constipation, diarrhea or blood in the stool.  GU: Denies urgency, frequency, pain with urination, burning sensation, blood in urine, odor or discharge. Musculoskeletal: Denies decrease in range of motion, difficulty with gait, muscle pain or joint pain and swelling.  Skin: Denies redness, rashes, lesions or ulcercations.  Neurological: Denies dizziness, difficulty with memory, difficulty with speech or problems with balance and coordination.  Psych: Denies anxiety, depression, SI/HI.  No other specific complaints in a complete review of systems (except as listed in HPI above).     Objective:   Physical Exam   BP 116/84 (BP Location: Left Arm, Patient Position: Sitting, Cuff Size: Normal)   Pulse 96   Temp 98 F (36.7 C) (Oral)   Ht _0  (1.676 m)   Wt 190 lb (86.2 kg)   SpO2 99%   BMI 30.67 kg/m   Wt Readings from Last 3  Encounters:  03/22/21 187 lb 6.4 oz (85 kg)  09/01/20 188 lb (85.3 kg)  05/07/20 183 lb 8 oz (83.2 kg)    General: Appears her stated age, obese, in NAD. Skin: Warm, dry and intact. No ulcerations noted. HEENT: Head: normal shape and size; Eyes: sclera white, no icterus, conjunctiva pink, PERRLA and EOMs intact;  Neck:  Neck supple, trachea midline. No masses, lumps or thyromegaly present.  Cardiovascular: Normal rate and rhythm. S1,S2 noted.  No murmur, rubs or gallops noted. No JVD or BLE edema.  Pulmonary/Chest: Normal effort and positive vesicular breath sounds. No respiratory distress. No wheezes, rales or ronchi noted.  Abdomen: Strength 5/5 BUE/BLE. No difficulty with gait.  Neurological: Alert and oriented. Cranial nerves II-XII grossly intact. Coordination normal.  Psychiatric: Mood and affect normal. Behavior is normal. Judgment and thought content normal.    BMET    Component Value Date/Time   NA 138 03/22/2021 0905   NA 140 03/21/2013 0000   K 4.7 03/22/2021 0905   CL 105 03/22/2021 0905   CO2 27 03/22/2021 0905   GLUCOSE 195 (H) 03/22/2021 0905   BUN 13 03/22/2021 0905   BUN 14 03/21/2013 0000   CREATININE 0.59 03/22/2021 0905   CALCIUM 9.0 03/22/2021 0905   GFRNONAA >60 10/18/2007 1053   GFRAA  10/18/2007 1053    >60        The eGFR has been calculated using the MDRD equation. This calculation has not been validated in all clinical    Lipid Panel     Component Value Date/Time   CHOL 167 03/22/2021 0905   TRIG 45 03/22/2021 0905   HDL 68 03/22/2021 0905   CHOLHDL 2.5 03/22/2021 0905   VLDL 8.6 04/07/2020 1126   LDLCALC 86 03/22/2021 0905    CBC    Component Value Date/Time   WBC 6.8 04/07/2020 1126   RBC 4.38 04/07/2020 1126   HGB 12.5 04/07/2020 1126   HCT 37.5 04/07/2020 1126   PLT 299.0 04/07/2020 1126   MCV 85.6 04/07/2020 1126   MCHC 33.5 04/07/2020 1126   RDW 13.4 04/07/2020 1126   LYMPHSABS 1.4 07/03/2019 1608   MONOABS 0.6 07/03/2019  1608   EOSABS 1.2 (H) 07/03/2019 1608   BASOSABS 0.1 07/03/2019 1608    Hgb A1C Lab Results  Component Value Date   HGBA1C 8.7 (H) 03/22/2021           Assessment & Plan:   Preventative Health Maintenance:  Encouraged her to get a flu shot in the fall She declines tetanus booster Encouraged her to get a COVID-vaccine She declines Pneumovax Pap smear UTD per her report, will request copy She prefers if GYN scheduled her mammogram Encouraged her to consume a balanced diet and exercise regimen Advised her to see an eye doctor and dentist annually We will check CBC,  c-Met, lipid, A1c, urine microalbumin and hep C today  RTC in 6 months, follow-up chronic conditions Webb Silversmith, NP

## 2021-12-27 NOTE — Patient Instructions (Signed)

## 2021-12-27 NOTE — Telephone Encounter (Signed)
Summary: Pharmacy states two script instructions   insulin glargine (LANTUS SOLOSTAR) 100 UNIT/ML Solostar Pen 15 mL 1 12/27/2021  Sig - Route: Inject 14 Units into the skin 2 (two) times daily. INJECT 16 UNITS TWICE DAILY - Subcutaneous  Sent to pharmacy as: insulin glargine (LANTUS SOLOSTAR) 100 UNIT/ML Solostar Pen    Pharmacy has called and needs clarity Inject 14 units  (2) times daily/   then  INJECT 16 UNITS  TWICE DAILY   FU with anyone at [facility] [place] - [address] [place] [address]  Phone: [phone] Fax: [phone]           Call History   Type Contact Phone/Fax User  12/27/2021 03:14 PM EDT Phone (Incoming) [facility] - [place], [place] - [address] (Pharmacy) [phone] Aretta Nip   Reason for Disposition  [1] Pharmacy calling with prescription question AND [2] triager unable to answer question  Answer Assessment - Initial Assessment Questions 1. NAME of MEDICATION: "What medicine are you calling about?"     Insulin Rx.  Pharmacy needing clarification on the order 2. QUESTION: "What is your question?" (e.g., double dose of medicine, side effect)     See pharmacy note 3. PRESCRIBING HCP: "Who prescribed it?" Reason: if prescribed by specialist, call should be referred to that group.     Nicki Reaper, NP 4. SYMPTOMS: "Do you have any symptoms?"     N/A 5. SEVERITY: If symptoms are present, ask "Are they mild, moderate or severe?"     N/A 6. PREGNANCY:  "Is there any chance that you are pregnant?" "When was your last menstrual period?"     N/A  Protocols used: Medication Question Call-A-AH

## 2021-12-27 NOTE — Assessment & Plan Note (Signed)
Encourage diet and exercise for weight loss 

## 2021-12-28 LAB — COMPLETE METABOLIC PANEL WITH GFR
AG Ratio: 1.4 (calc) (ref 1.0–2.5)
ALT: 12 U/L (ref 6–29)
AST: 11 U/L (ref 10–30)
Albumin: 3.9 g/dL (ref 3.6–5.1)
Alkaline phosphatase (APISO): 65 U/L (ref 31–125)
BUN: 16 mg/dL (ref 7–25)
CO2: 28 mmol/L (ref 20–32)
Calcium: 9.1 mg/dL (ref 8.6–10.2)
Chloride: 103 mmol/L (ref 98–110)
Creat: 0.76 mg/dL (ref 0.50–0.99)
Globulin: 2.7 g/dL (calc) (ref 1.9–3.7)
Glucose, Bld: 295 mg/dL — ABNORMAL HIGH (ref 65–139)
Potassium: 4.6 mmol/L (ref 3.5–5.3)
Sodium: 137 mmol/L (ref 135–146)
Total Bilirubin: 0.4 mg/dL (ref 0.2–1.2)
Total Protein: 6.6 g/dL (ref 6.1–8.1)
eGFR: 99 mL/min/{1.73_m2} (ref >=60)

## 2021-12-28 LAB — CBC
HCT: 38 % (ref 35.0–45.0)
Hemoglobin: 12.4 g/dL (ref 11.7–15.5)
MCH: 28.4 pg (ref 27.0–33.0)
MCHC: 32.6 g/dL (ref 32.0–36.0)
MCV: 87 fL (ref 80.0–100.0)
MPV: 10.3 fL (ref 7.5–12.5)
Platelets: 359 10*3/uL (ref 140–400)
RBC: 4.37 10*6/uL (ref 3.80–5.10)
RDW: 12.5 % (ref 11.0–15.0)
WBC: 9.5 10*3/uL (ref 3.8–10.8)

## 2021-12-28 LAB — LIPID PANEL
Cholesterol: 186 mg/dL (ref <200)
HDL: 76 mg/dL (ref >=50)
LDL Cholesterol (Calc): 95 mg/dL (calc)
Non-HDL Cholesterol (Calc): 110 mg/dL (calc) (ref <130)
Total CHOL/HDL Ratio: 2.4 (calc) (ref <5.0)
Triglycerides: 64 mg/dL (ref <150)

## 2021-12-28 LAB — HEMOGLOBIN A1C
Hgb A1c MFr Bld: 8.6 % of total Hgb — ABNORMAL HIGH (ref <5.7)
Mean Plasma Glucose: 200 mg/dL
eAG (mmol/L): 11.1 mmol/L

## 2021-12-28 LAB — HEPATITIS C ANTIBODY: Hepatitis C Ab: NONREACTIVE

## 2021-12-28 LAB — MICROALBUMIN / CREATININE URINE RATIO
Creatinine, Urine: 44 mg/dL (ref 20–275)
Microalb, Ur: <0.2 mg/dL

## 2021-12-29 ENCOUNTER — Telehealth: Payer: Self-pay

## 2021-12-29 DIAGNOSIS — E1065 Type 1 diabetes mellitus with hyperglycemia: Secondary | ICD-10-CM

## 2021-12-29 MED ORDER — LANTUS SOLOSTAR 100 UNIT/ML ~~LOC~~ SOPN: 16.0000 [IU] | PEN_INJECTOR | Freq: Two times a day (BID) | SUBCUTANEOUS | 1 refills | Status: DC

## 2021-12-29 NOTE — Addendum Note (Signed)
Addended by: Lorre Munroe on: 12/29/2021 07:36 AM   Modules accepted: Orders

## 2021-12-29 NOTE — Telephone Encounter (Unsigned)
Copied from CRM [id number]. Topic: General - Other >> Dec 29, 2021  9:55 AM Macon Large wrote: Reason for CRM: [facility] requests that the Rx for insulin glargine (LANTUS SOLOSTAR) 100 UNIT/ML Solostar Pen directions include up to 50 units daily so it  can be processed for 30 day supply through pt insurance.

## 2021-12-30 MED ORDER — LANTUS SOLOSTAR 100 UNIT/ML ~~LOC~~ SOPN: PEN_INJECTOR | SUBCUTANEOUS | 1 refills | Status: DC

## 2021-12-30 NOTE — Addendum Note (Signed)
Addended by: Lorre Munroe on: 12/30/2021 08:00 AM   Modules accepted: Orders

## 2022-01-21 ENCOUNTER — Ambulatory Visit: Payer: 59 | Admitting: Nurse Practitioner

## 2022-01-21 ENCOUNTER — Encounter: Payer: Self-pay | Admitting: Nurse Practitioner

## 2022-01-21 VITALS — BP 113/74 | HR 79 | Ht 66.0 in | Wt 187.0 lb

## 2022-01-21 DIAGNOSIS — E1065 Type 1 diabetes mellitus with hyperglycemia: Secondary | ICD-10-CM

## 2022-01-21 MED ORDER — DEXCOM G6 SENSOR MISC: 3 refills | Status: DC

## 2022-01-21 MED ORDER — OMNIPOD 5 DEXG7G6 PODS GEN 5 MISC: 6 refills | Status: DC

## 2022-01-21 MED ORDER — OMNIPOD 5 DEXG7G6 INTRO GEN 5 KIT: PACK | 0 refills | Status: DC

## 2022-01-21 MED ORDER — DEXCOM G6 TRANSMITTER MISC: 3 refills | Status: DC

## 2022-01-21 NOTE — Patient Instructions (Signed)

## 2022-01-21 NOTE — Progress Notes (Signed)
Endocrinology Consult Note       01/21/2022, 10:33 AM   Subjective:    Patient ID: Ellen [name], female    DOB: 1976-02-24.  Ellen [name] is being seen in consultation for management of currently uncontrolled symptomatic diabetes requested by  Jearld Fenton, NP.   Past Medical History:  Diagnosis Date   DM (diabetes mellitus), type 1 ([place])    Frequent headaches    Hyperlipidemia     Past Surgical History:  Procedure Laterality Date   CESAREAN SECTION  7/04,9/07.4/09    Social History   Socioeconomic History   Marital status: Married    Spouse name: Not on file   Number of children: Not on file   Years of education: Not on file   Highest education level: Not on file  Occupational History   Not on file  Tobacco Use   Smoking status: Never   Smokeless tobacco: Never  Vaping Use   Vaping Use: Never used  Substance and Sexual Activity   Alcohol use: No   Drug use: No   Sexual activity: Yes  Other Topics Concern   Not on file  Social History Narrative   Not on file   Social Determinants of Health   Financial Resource Strain: Not on file  Food Insecurity: Not on file  Transportation Needs: Not on file  Physical Activity: Not on file  Stress: Not on file  Social Connections: Not on file    Family History  Problem Relation Age of Onset   Hyperlipidemia Mother    Cancer Father        prostate   Arthritis Maternal Grandmother    Hyperlipidemia Maternal Grandmother    Hypertension Maternal Grandmother    Stroke Maternal Grandfather    Hypertension Maternal Grandfather    Stroke Paternal Grandmother    Diabetes Paternal Grandmother     Outpatient Encounter Medications as of 01/21/2022  Medication Sig   Continuous Blood Gluc Sensor (DEXCOM G6 SENSOR) MISC Change sensor every 10 days as directed   Continuous Blood Gluc Transmit (DEXCOM G6 TRANSMITTER) MISC Change transmitter every 90  days as directed.   Insulin Disposable Pump (OMNIPOD 5 G6 INTRO, GEN 5,) KIT Change pod every 48-72 hours   Insulin Disposable Pump (OMNIPOD 5 G6 POD, GEN 5,) MISC Change pod every 48-72 hours   cetirizine (ZYRTEC) 10 MG tablet Take 10 mg by mouth daily. (Patient not taking: Reported on 01/21/2022)   Continuous Blood Gluc Sensor (FREESTYLE LIBRE 2 SENSOR) MISC 1 each by Does not apply route every 14 (fourteen) days.   glucagon (GLUCAGON EMERGENCY) 1 MG injection Inject 1 mg into the muscle once as needed.   insulin glargine (LANTUS SOLOSTAR) 100 UNIT/ML Solostar Pen Inject up to 50 units sq twice daily (Patient taking differently: Inject up to 16 units sq twice daily)   insulin lispro (HUMALOG KWIKPEN) 100 UNIT/ML KwikPen INJECT UP TO 50 UNITS UNDER THE SKIN DAILY AS DIRECTED (Patient taking differently: Inject into the skin 3 (three) times daily. INJECT UP TO 50 UNITS UNDER THE SKIN DAILY AS DIRECTED 1 units /10 carbs)   No facility-administered encounter medications on file as of 01/21/2022.  ALLERGIES: No Known Allergies  VACCINATION STATUS: Immunization History  Administered Date(s) Administered   Influenza Split 04/27/2013   PPD Test 07/11/2017    Diabetes She presents for her initial diabetic visit. She has type 1 diabetes mellitus. Onset time: Diagnosed at approx age of 59. Her disease course has been stable. Hypoglycemia symptoms include nervousness/anxiousness, sweats and tremors. There are no diabetic associated symptoms. Hypoglycemia complications include nocturnal hypoglycemia. Symptoms are stable. There are no diabetic complications. Risk factors for coronary artery disease include diabetes mellitus. Current diabetic treatment includes intensive insulin program. She is compliant with treatment all of the time. Her weight is fluctuating minimally. She is following a generally healthy diet. Meal planning includes avoidance of concentrated sweets and carbohydrate counting. She has  had a previous visit with a dietitian. She participates in exercise intermittently. Her overall blood glucose range is 180-200 mg/dl. (She presents today for her consultation with no meter or logs to review.  Her most recent A1c on 7/3 was 8.6%.  She does not routinely monitor glucose at home due to cost of CGM.  She has been on pump in the past but was too expensive for her to keep up with.  She eats 3 routine meals per day, occasional snacks.  She is very fearful of low glucose readings as she has had severe lows in the past.) An ACE inhibitor/angiotensin II receptor blocker is not being taken. She does not see a podiatrist.Eye exam is current.     Review of systems  Constitutional: + Minimally fluctuating body weight, current Body mass index is 30.18 kg/m., no fatigue, no subjective hyperthermia, no subjective hypothermia Eyes: no blurry vision, no xerophthalmia ENT: no sore throat, no nodules palpated in throat, no dysphagia/odynophagia, no hoarseness Cardiovascular: no chest pain, no shortness of breath, no palpitations, no leg swelling Respiratory: no cough, no shortness of breath Gastrointestinal: no nausea/vomiting/diarrhea Musculoskeletal: no muscle/joint aches Skin: no rashes, no hyperemia Neurological: no tremors, no numbness, no tingling, no dizziness Psychiatric: no depression, no anxiety  Objective:     BP 113/74   Pulse 79   Ht _0  (1.676 m)   Wt 187 lb (84.8 kg)   BMI 30.18 kg/m   Wt Readings from Last 3 Encounters:  01/21/22 187 lb (84.8 kg)  12/27/21 190 lb (86.2 kg)  03/22/21 187 lb 6.4 oz (85 kg)     BP Readings from Last 3 Encounters:  01/21/22 113/74  12/27/21 116/84  03/22/21 101/61     Physical Exam- Limited  Constitutional:  Body mass index is 30.18 kg/m. , not in acute distress, normal state of mind Eyes:  EOMI, no exophthalmos Neck: Supple Musculoskeletal: no gross deformities, strength intact in all four extremities, no gross restriction of  joint movements Skin:  no rashes, no hyperemia Neurological: no tremor with outstretched hands    CMP ( most recent) CMP     Component Value Date/Time   NA 137 12/27/2021 1447   NA 140 03/21/2013 0000   K 4.6 12/27/2021 1447   CL 103 12/27/2021 1447   CO2 28 12/27/2021 1447   GLUCOSE 295 (H) 12/27/2021 1447   BUN 16 12/27/2021 1447   BUN 14 03/21/2013 0000   CREATININE 0.76 12/27/2021 1447   CALCIUM 9.1 12/27/2021 1447   PROT 6.6 12/27/2021 1447   ALBUMIN 3.9 04/07/2020 1126   AST 11 12/27/2021 1447   ALT 12 12/27/2021 1447   ALKPHOS 62 04/07/2020 1126   BILITOT 0.4 12/27/2021 1447   GFRNONAA >  60 10/18/2007 1053   GFRAA  10/18/2007 1053    >60        The eGFR has been calculated using the MDRD equation. This calculation has not been validated in all clinical     Diabetic Labs (most recent): Lab Results  Component Value Date   HGBA1C 8.6 (H) 12/27/2021   HGBA1C 8.7 (H) 03/22/2021   HGBA1C 8.6 (A) 09/01/2020   MICROALBUR <0.2 12/27/2021   MICROALBUR <0.7 04/07/2020   MICROALBUR <0.7 10/17/2018     Lipid Panel ( most recent) Lipid Panel     Component Value Date/Time   CHOL 186 12/27/2021 1447   TRIG 64 12/27/2021 1447   HDL 76 12/27/2021 1447   CHOLHDL 2.4 12/27/2021 1447   VLDL 8.6 04/07/2020 1126   LDLCALC 95 12/27/2021 1447      Lab Results  Component Value Date   TSH 1.16 10/17/2018   TSH 1.51 10/17/2014   TSH 0.98 09/06/2013           Assessment & Plan:   1) Type 1 diabetes mellitus with hyperglycemia ([place])  She presents today for her consultation with no meter or logs to review.  Her most recent A1c on 7/3 was 8.6%.  She does not routinely monitor glucose at home due to cost of CGM.  She has been on pump in the past but was too expensive for her to keep up with.  She eats 3 routine meals per day, occasional snacks.  She is very fearful of low glucose readings as she has had severe lows in the past.  - Ellen [name] has currently  uncontrolled symptomatic type 1 DM since 46 years of age, with most recent A1c of 8.6 %.   -Recent labs reviewed.  - I had a long discussion with her about the progressive nature of diabetes and the pathology behind its complications. -her diabetes is not currently complicated but she remains at a high risk for more acute and chronic complications which include CAD, CVA, CKD, retinopathy, and neuropathy. These are all discussed in detail with her.  The following Lifestyle Medicine recommendations according to [facility]) were discussed and offered to patient and she agrees to start the journey:  A. Whole Foods, Plant-based plate comprising of fruits and vegetables, plant-based proteins, whole-grain carbohydrates was discussed in detail with the patient.   A list for source of those nutrients were also provided to the patient.  Patient will use only water or unsweetened tea for hydration. B.  The need to stay away from risky substances including alcohol, smoking; obtaining 7 to 9 hours of restorative sleep, at least 150 minutes of moderate intensity exercise weekly, the importance of healthy social connections,  and stress reduction techniques were discussed. C.  A full color page of  Calorie density of various food groups per pound showing examples of each food groups was provided to the patient.  - I have counseled her on diet and weight management by adopting a carbohydrate restricted/protein rich diet. Patient is encouraged to switch to unprocessed or minimally processed complex starch and increased protein intake (animal or plant source), fruits, and vegetables. -  she is advised to stick to a routine mealtimes to eat 3 meals a day and avoid unnecessary snacks (to snack only to correct hypoglycemia).   - she acknowledges that there is a room for improvement in her food and drink choices. - Suggestion is made for her to avoid simple carbohydrates from  her diet  including Cakes, Sweet Desserts, Ice Cream, Soda (diet and regular), Sweet Tea, Candies, Chips, Cookies, Store Bought Juices, Alcohol in Excess of 1-2 drinks a day, Artificial Sweeteners, Coffee Creamer, and "Sugar-free" Products. This will help patient to have more stable blood glucose profile and potentially avoid unintended weight gain.  - I have approached her with the following individualized plan to manage her diabetes and patient agrees:   -Given her diagnosis of type 1 diabetes, insulin is the exclusive choice of treatment for her.  She is advised to continue her current regimen with Lantus 16 units twice daily and Humalog via carbs taken in at each meal.  She could definitely benefit from CGM and pump.  I discussed and initiated Omnipod 5 and Dexcom G6 for her today.  She is worried about price, as previously it was too expensive to keep up.  -she is encouraged to start monitoring glucose 4 times daily, before meals and before bed.  - she is warned not to take insulin without proper monitoring per orders. - Adjustment parameters are given to her for hypo and hyperglycemia in writing. - she is encouraged to call clinic for blood glucose levels less than 70 or above 300 mg /dl.  - Specific targets for  A1c; LDL, HDL, and Triglycerides were discussed with the patient.  2) Blood Pressure /Hypertension:  her blood pressure is controlled to target without the use of antihypertensive medications.   3) Lipids/Hyperlipidemia:    Review of her recent lipid panel from 12/27/21 showed controlled LDL at 95.  She is not currently on any lipid lowering medications.    4)  Weight/Diet:  her Body mass index is 30.18 kg/m.  -  clearly complicating her diabetes care.   she is a candidate for weight loss. I discussed with her the fact that loss of 5 - 10% of her  current body weight will have the most impact on her diabetes management.  Exercise, and detailed carbohydrates information provided  -   detailed on discharge instructions.  5) Chronic Care/Health Maintenance: -she is not on on ACEI/ARB or Statin medications and is encouraged to initiate and continue to follow up with Ophthalmology, Dentist, Podiatrist at least yearly or according to recommendations, and advised to stay away from smoking. I have recommended yearly flu vaccine and pneumonia vaccine at least every 5 years; moderate intensity exercise for up to 150 minutes weekly; and sleep for at least 7 hours a day.  - she is advised to maintain close follow up with Jearld Fenton, NP for primary care needs, as well as her other providers for optimal and coordinated care.   - Time spent in this patient care: 60 min, of which > 50% was spent in counseling her about her diabetes and the rest reviewing her blood glucose logs, discussing her hypoglycemia and hyperglycemia episodes, reviewing her current and previous labs/studies (including abstraction from other facilities) and medications doses and developing a long term treatment plan based on the latest standards of care/guidelines; and documenting her care.    Please refer to Patient Instructions for Blood Glucose Monitoring and Insulin/Medications Dosing Guide" in media tab for additional information. Please also refer to "Patient Self Inventory" in the Media tab for reviewed elements of pertinent patient history.  Crosby Oyster participated in the discussions, expressed understanding, and voiced agreement with the above plans.  All questions were answered to her satisfaction. she is encouraged to contact clinic should she have any questions or  concerns prior to her return visit.     Follow up plan: - Return in about 3 months (around 04/23/2022) for Diabetes F/U, Bring meter and logs.    Rayetta Pigg, [facility] [address] [place], [place] [address] Phone: [phone] Fax: [phone]  01/21/2022, 10:33 AM

## 2022-02-01 ENCOUNTER — Telehealth: Payer: Self-pay | Admitting: Nurse Practitioner

## 2022-02-01 MED ORDER — OMNIPOD 5 DEXG7G6 PODS GEN 5 MISC: 6 refills | Status: DC

## 2022-02-01 MED ORDER — INSULIN LISPRO 100 UNIT/ML IJ SOLN: INTRAMUSCULAR | 11 refills | Status: DC

## 2022-02-01 NOTE — Addendum Note (Signed)
Addended by: Dani Gobble on: 02/01/2022 02:26 PM   Modules accepted: Orders

## 2022-02-01 NOTE — Telephone Encounter (Signed)
[facility] said they received a RX from [facility] for her Omnipod but they need a RX for the vials and the pods. This is for the Ominpod G6. Thank you!

## 2022-02-01 NOTE — Telephone Encounter (Signed)
done

## 2022-02-01 NOTE — Telephone Encounter (Signed)
Can you send in the pods for qty of 15 with 6 refills because she is already paying $40.00 & the pharmacist said she would have to come there every 10 days

## 2022-02-02 ENCOUNTER — Telehealth: Payer: Self-pay | Admitting: Nurse Practitioner

## 2022-02-02 NOTE — Telephone Encounter (Signed)
Ellen [name] also told me she can use a coupon from [employer] (on their website), which will help cover costs as well.

## 2022-02-02 NOTE — Telephone Encounter (Signed)
New message    Pt c/o medication issue:  1. Name of Medication: DEXCOM G6 SENSOR) MISC  2. How are you currently taking this medication (dosage and times per day)?   3. Are you having a reaction (difficulty breathing--STAT)? no  4. What is your medication issue? Dexcom is more expensive - any options to rewrite the prescription to received a low copayment cost.

## 2022-02-02 NOTE — Telephone Encounter (Signed)
Pt called to let us know that she has not received the Dexcom or omnipod yet. She states that everything all together after PA was going to be $300/month. She is not willing to pay that much but the prescriptions [facility] and she is waiting to see what that will be.

## 2022-02-09 ENCOUNTER — Other Ambulatory Visit: Payer: Self-pay

## 2022-02-09 MED ORDER — DEXCOM G6 SENSOR MISC: 1 refills | Status: DC

## 2022-02-09 MED ORDER — DEXCOM G6 TRANSMITTER MISC: 0 refills | Status: DC

## 2022-03-03 ENCOUNTER — Other Ambulatory Visit: Payer: Self-pay | Admitting: Nurse Practitioner

## 2022-03-03 MED ORDER — INSULIN LISPRO 100 UNIT/ML IJ SOLN: INTRAMUSCULAR | 3 refills | Status: DC

## 2022-03-14 ENCOUNTER — Telehealth: Payer: Self-pay | Admitting: Nurse Practitioner

## 2022-03-14 NOTE — Telephone Encounter (Signed)
Pt said that her sugar is dropping during the night. Can you review?

## 2022-03-14 NOTE — Telephone Encounter (Signed)
Called patient ,she states that Whitney called her and discussed what the issue may be and also her basil rate was adjusted.

## 2022-03-14 NOTE — Telephone Encounter (Signed)
I sent her an email to link up with [place] for Dexcom.  Once she does that I will be able to see her numbers.  Also, when I looked in the [place], looks like she is in manual mode which can contribute to some of the low readings as it is not basing it off her glucose readings.

## 2022-04-29 ENCOUNTER — Ambulatory Visit: Payer: 59 | Admitting: Nurse Practitioner

## 2022-04-29 ENCOUNTER — Encounter: Payer: Self-pay | Admitting: Nurse Practitioner

## 2022-04-29 VITALS — BP 113/75 | HR 79 | Ht 66.0 in | Wt 188.2 lb

## 2022-04-29 DIAGNOSIS — E1065 Type 1 diabetes mellitus with hyperglycemia: Secondary | ICD-10-CM | POA: Diagnosis not present

## 2022-04-29 LAB — POCT GLYCOSYLATED HEMOGLOBIN (HGB A1C): Hemoglobin A1C: 8.2 % — AB (ref 4.0–5.6)

## 2022-04-29 MED ORDER — INSULIN LISPRO 100 UNIT/ML IJ SOLN: INTRAMUSCULAR | 3 refills | Status: DC

## 2022-04-29 NOTE — Progress Notes (Signed)
Endocrinology Follow UpNote       04/29/2022, 10:08 AM   Subjective:    Patient ID: Ellen [name], female    DOB: 27-May-1976.  Ellen [name] is being seen in follow up after being seen in consultation for management of currently uncontrolled symptomatic diabetes requested by  Jearld Fenton, NP.   Past Medical History:  Diagnosis Date   DM (diabetes mellitus), type 1 ([place])    Frequent headaches    Hyperlipidemia     Past Surgical History:  Procedure Laterality Date   CESAREAN SECTION  7/04,9/07.4/09    Social History   Socioeconomic History   Marital status: Married    Spouse name: Not on file   Number of children: Not on file   Years of education: Not on file   Highest education level: Not on file  Occupational History   Not on file  Tobacco Use   Smoking status: Never   Smokeless tobacco: Never  Vaping Use   Vaping Use: Never used  Substance and Sexual Activity   Alcohol use: No   Drug use: No   Sexual activity: Yes  Other Topics Concern   Not on file  Social History Narrative   Not on file   Social Determinants of Health   Financial Resource Strain: Not on file  Food Insecurity: Not on file  Transportation Needs: Not on file  Physical Activity: Not on file  Stress: Not on file  Social Connections: Not on file    Family History  Problem Relation Age of Onset   Hyperlipidemia Mother    Cancer Father        prostate   Arthritis Maternal Grandmother    Hyperlipidemia Maternal Grandmother    Hypertension Maternal Grandmother    Stroke Maternal Grandfather    Hypertension Maternal Grandfather    Stroke Paternal Grandmother    Diabetes Paternal Grandmother     Outpatient Encounter Medications as of 04/29/2022  Medication Sig   Continuous Blood Gluc Sensor (DEXCOM G6 SENSOR) MISC Change sensor every 10 days as directed   Continuous Blood Gluc Transmit (DEXCOM G6 TRANSMITTER) MISC  Change transmitter every 90 days as directed.   Insulin Disposable Pump (OMNIPOD 5 G6 INTRO, GEN 5,) KIT Change pod every 48-72 hours   Insulin Disposable Pump (OMNIPOD 5 G6 POD, GEN 5,) MISC Change pod every 48-72 hours   [DISCONTINUED] insulin lispro (HUMALOG) 100 UNIT/ML injection Use with Omnipod for TDD around 45 units   insulin lispro (HUMALOG) 100 UNIT/ML injection Use with Omnipod for TDD around 45 units   [DISCONTINUED] cetirizine (ZYRTEC) 10 MG tablet Take 10 mg by mouth daily. (Patient not taking: Reported on 01/21/2022)   [DISCONTINUED] glucagon (GLUCAGON EMERGENCY) 1 MG injection Inject 1 mg into the muscle once as needed. (Patient not taking: Reported on 04/29/2022)   [DISCONTINUED] insulin glargine (LANTUS SOLOSTAR) 100 UNIT/ML Solostar Pen Inject up to 50 units sq twice daily (Patient not taking: Reported on 04/29/2022)   No facility-administered encounter medications on file as of 04/29/2022.    ALLERGIES: No Known Allergies  VACCINATION STATUS: Immunization History  Administered Date(s) Administered   Influenza Split 04/27/2013   PPD Test 07/11/2017  Diabetes She presents for her follow-up diabetic visit. She has type 1 diabetes mellitus. Onset time: Diagnosed at approx age of 89. Her disease course has been improving. Hypoglycemia symptoms include nervousness/anxiousness, sweats and tremors. There are no diabetic associated symptoms. There are no hypoglycemic complications. Symptoms are stable. There are no diabetic complications. Risk factors for coronary artery disease include diabetes mellitus. Current diabetic treatment includes insulin pump. She is compliant with treatment all of the time. Her weight is fluctuating minimally. She is following a generally healthy diet. Meal planning includes avoidance of concentrated sweets and carbohydrate counting. She has had a previous visit with a dietitian. She participates in exercise intermittently. Her home blood glucose trend is  decreasing steadily. Her overall blood glucose range is 140-180 mg/dl. (She presents today with her CGM and Omnipod showing improved glycemic profile, yet still above target.  Her POCT A1c today is 8.2%, improving from last visit of 8.6%.  Analysis of her CGM shows TIR 59%, TAR 40%, TBR <1% with a GMI of 7.5%.  She is still learning about her pump, has been afraid to bolus too much at meals as she has a tendency to drop.  She notes her pump sometimes won't allow her to do correction bolus.) An ACE inhibitor/angiotensin II receptor blocker is not being taken. She does not see a podiatrist.Eye exam is current.     Review of systems  Constitutional: + Minimally fluctuating body weight, current Body mass index is 30.38 kg/m., no fatigue, no subjective hyperthermia, no subjective hypothermia Eyes: no blurry vision, no xerophthalmia ENT: no sore throat, no nodules palpated in throat, no dysphagia/odynophagia, no hoarseness Cardiovascular: no chest pain, no shortness of breath, no palpitations, no leg swelling Respiratory: no cough, no shortness of breath Gastrointestinal: no nausea/vomiting/diarrhea Musculoskeletal: no muscle/joint aches Skin: no rashes, no hyperemia Neurological: no tremors, no numbness, no tingling, no dizziness Psychiatric: no depression, no anxiety  Objective:     BP 113/75 (BP Location: Left Arm, Patient Position: Sitting, Cuff Size: Normal)   Pulse 79   Ht _0  (1.676 m)   Wt 188 lb 3.2 oz (85.4 kg)   BMI 30.38 kg/m   Wt Readings from Last 3 Encounters:  04/29/22 188 lb 3.2 oz (85.4 kg)  01/21/22 187 lb (84.8 kg)  12/27/21 190 lb (86.2 kg)     BP Readings from Last 3 Encounters:  04/29/22 113/75  01/21/22 113/74  12/27/21 116/84      Physical Exam- Limited  Constitutional:  Body mass index is 30.38 kg/m. , not in acute distress, normal state of mind Eyes:  EOMI, no exophthalmos Neck: Supple Musculoskeletal: no gross deformities, strength intact in all  four extremities, no gross restriction of joint movements Skin:  no rashes, no hyperemia Neurological: no tremor with outstretched hands    CMP ( most recent) CMP     Component Value Date/Time   NA 137 12/27/2021 1447   NA 140 03/21/2013 0000   K 4.6 12/27/2021 1447   CL 103 12/27/2021 1447   CO2 28 12/27/2021 1447   GLUCOSE 295 (H) 12/27/2021 1447   BUN 16 12/27/2021 1447   BUN 14 03/21/2013 0000   CREATININE 0.76 12/27/2021 1447   CALCIUM 9.1 12/27/2021 1447   PROT 6.6 12/27/2021 1447   ALBUMIN 3.9 04/07/2020 1126   AST 11 12/27/2021 1447   ALT 12 12/27/2021 1447   ALKPHOS 62 04/07/2020 1126   BILITOT 0.4 12/27/2021 1447   GFRNONAA >60 10/18/2007 1053   GFRAA  10/18/2007 1053    >60        The eGFR has been calculated using the MDRD equation. This calculation has not been validated in all clinical     Diabetic Labs (most recent): Lab Results  Component Value Date   HGBA1C 8.2 (A) 04/29/2022   HGBA1C 8.6 (H) 12/27/2021   HGBA1C 8.7 (H) 03/22/2021   MICROALBUR <0.2 12/27/2021   MICROALBUR <0.7 04/07/2020   MICROALBUR <0.7 10/17/2018     Lipid Panel ( most recent) Lipid Panel     Component Value Date/Time   CHOL 186 12/27/2021 1447   TRIG 64 12/27/2021 1447   HDL 76 12/27/2021 1447   CHOLHDL 2.4 12/27/2021 1447   VLDL 8.6 04/07/2020 1126   LDLCALC 95 12/27/2021 1447      Lab Results  Component Value Date   TSH 1.16 10/17/2018   TSH 1.51 10/17/2014   TSH 0.98 09/06/2013           Assessment & Plan:   1) Type 1 diabetes mellitus with hyperglycemia ([place])  She presents today with her CGM and Omnipod showing improved glycemic profile, yet still above target.  Her POCT A1c today is 8.2%, improving from last visit of 8.6%.  Analysis of her CGM shows TIR 59%, TAR 40%, TBR <1% with a GMI of 7.5%.  She is still learning about her pump, has been afraid to bolus too much at meals as she has a tendency to drop.  She notes her pump sometimes won't allow her  to do correction bolus.  - Ellen [name] has currently uncontrolled symptomatic type 1 DM since 46 years of age.   -Recent labs reviewed.  - I had a long discussion with her about the progressive nature of diabetes and the pathology behind its complications. -her diabetes is not currently complicated but she remains at a high risk for more acute and chronic complications which include CAD, CVA, CKD, retinopathy, and neuropathy. These are all discussed in detail with her.  The following Lifestyle Medicine recommendations according to [facility]) were discussed and offered to patient and she agrees to start the journey:  A. Whole Foods, Plant-based plate comprising of fruits and vegetables, plant-based proteins, whole-grain carbohydrates was discussed in detail with the patient.   A list for source of those nutrients were also provided to the patient.  Patient will use only water or unsweetened tea for hydration. B.  The need to stay away from risky substances including alcohol, smoking; obtaining 7 to 9 hours of restorative sleep, at least 150 minutes of moderate intensity exercise weekly, the importance of healthy social connections,  and stress reduction techniques were discussed. C.  A full color page of  Calorie density of various food groups per pound showing examples of each food groups was provided to the patient.  - Nutritional counseling repeated at each appointment due to patients tendency to fall back in to old habits.  - The patient admits there is a room for improvement in their diet and drink choices. -  Suggestion is made for the patient to avoid simple carbohydrates from their diet including Cakes, Sweet Desserts / Pastries, Ice Cream, Soda (diet and regular), Sweet Tea, Candies, Chips, Cookies, Sweet Pastries, Store Bought Juices, Alcohol in Excess of 1-2 drinks a day, Artificial Sweeteners, Coffee Creamer, and "Sugar-free" Products. This will help  patient to have stable blood glucose profile and potentially avoid unintended weight gain.   - I encouraged the  patient to switch to unprocessed or minimally processed complex starch and increased protein intake (animal or plant source), fruits, and vegetables.   - Patient is advised to stick to a routine mealtimes to eat 3 meals a day and avoid unnecessary snacks (to snack only to correct hypoglycemia).  - I have approached her with the following individualized plan to manage her diabetes and patient agrees:   -Given her diagnosis of type 1 diabetes, insulin is the exclusive choice of treatment for her.  The only change to her Omnipod 5 pump settings was to adjust target glucose to 110 instead of 120.  Hopefully this will allow her tighter control of postprandial hyperglycemia. She is loving the Omnipod 5/Dexcom combo.  She would like to try and lose weight.  We discussed weight loss meds like Wegovy but it is generally very expensive which is a deterrent.  She will contact her insurance and see if such meds are covered.  Will discuss on future visits as well.  -she is encouraged to continue monitoring glucose 4 times daily, before meals and before bed and to call the clinic if she has readings less than 70 or above 200 for 3 tests in a row.  - she is warned not to take insulin without proper monitoring per orders. - Adjustment parameters are given to her for hypo and hyperglycemia in writing.  - Specific targets for  A1c; LDL, HDL, and Triglycerides were discussed with the patient.  2) Blood Pressure /Hypertension:  her blood pressure is controlled to target without the use of antihypertensive medications.   3) Lipids/Hyperlipidemia:    Review of her recent lipid panel from 12/27/21 showed controlled LDL at 95.  She is not currently on any lipid lowering medications.    4)  Weight/Diet:  her Body mass index is 30.38 kg/m.  -  clearly complicating her diabetes care.   she is a candidate  for weight loss. I discussed with her the fact that loss of 5 - 10% of her  current body weight will have the most impact on her diabetes management.  Exercise, and detailed carbohydrates information provided  -  detailed on discharge instructions.  5) Chronic Care/Health Maintenance: -she is not on on ACEI/ARB or Statin medications and is encouraged to initiate and continue to follow up with Ophthalmology, Dentist, Podiatrist at least yearly or according to recommendations, and advised to stay away from smoking. I have recommended yearly flu vaccine and pneumonia vaccine at least every 5 years; moderate intensity exercise for up to 150 minutes weekly; and sleep for at least 7 hours a day.  - she is advised to maintain close follow up with Jearld Fenton, NP for primary care needs, as well as her other providers for optimal and coordinated care.     I spent 40 minutes in the care of the patient today including review of labs from [facility], Lipids, Thyroid Function, Hematology (current and previous including abstractions from other facilities); face-to-face time discussing  her blood glucose readings/logs, discussing hypoglycemia and hyperglycemia episodes and symptoms, medications doses, her options of short and long term treatment based on the latest standards of care / guidelines;  discussion about incorporating lifestyle medicine;  and documenting the encounter. Risk reduction counseling performed per USPSTF guidelines to reduce obesity and cardiovascular risk factors.     Please refer to Patient Instructions for Blood Glucose Monitoring and Insulin/Medications Dosing Guide"  in media tab for additional information. Please  also refer to " Patient  Self Inventory" in the Media  tab for reviewed elements of pertinent patient history.  Crosby Oyster participated in the discussions, expressed understanding, and voiced agreement with the above plans.  All questions were answered to her satisfaction. she is  encouraged to contact clinic should she have any questions or concerns prior to her return visit.     Follow up plan: - Return in about 4 months (around 08/28/2022) for No previsit labs, Diabetes F/U with A1c in office, Bring meter and logs.    Rayetta Pigg, [facility] [address] [place], [place] [address] Phone: [phone] Fax: [phone]  04/29/2022, 10:08 AM

## 2022-05-02 ENCOUNTER — Telehealth: Payer: Self-pay | Admitting: Nurse Practitioner

## 2022-05-02 MED ORDER — INSULIN LISPRO 100 UNIT/ML IJ SOLN: INTRAMUSCULAR | 3 refills | Status: DC

## 2022-05-02 NOTE — Telephone Encounter (Signed)
[facility] called and said they did the math wrong on the humalog vials. They need the R to say 67 units instead of 50

## 2022-05-02 NOTE — Addendum Note (Signed)
Addended by: Brita Romp on: 05/02/2022 08:05 AM   Modules accepted: Orders

## 2022-05-02 NOTE — Telephone Encounter (Signed)
done

## 2022-06-07 ENCOUNTER — Encounter: Payer: Self-pay | Admitting: Nurse Practitioner

## 2022-06-08 MED ORDER — INSULIN LISPRO 100 UNIT/ML IJ SOLN: INTRAMUSCULAR | 3 refills | Status: DC

## 2022-07-06 IMAGING — DX DG KNEE AP/LAT W/ SUNRISE*L*
3 series · 3 of 3 positions shown · non-contrast
Comparison: None.

CLINICAL DATA: Bilateral knee pain for years. No trauma history
submitted.

EXAM:
LEFT KNEE 3 VIEWS

[knee ap]
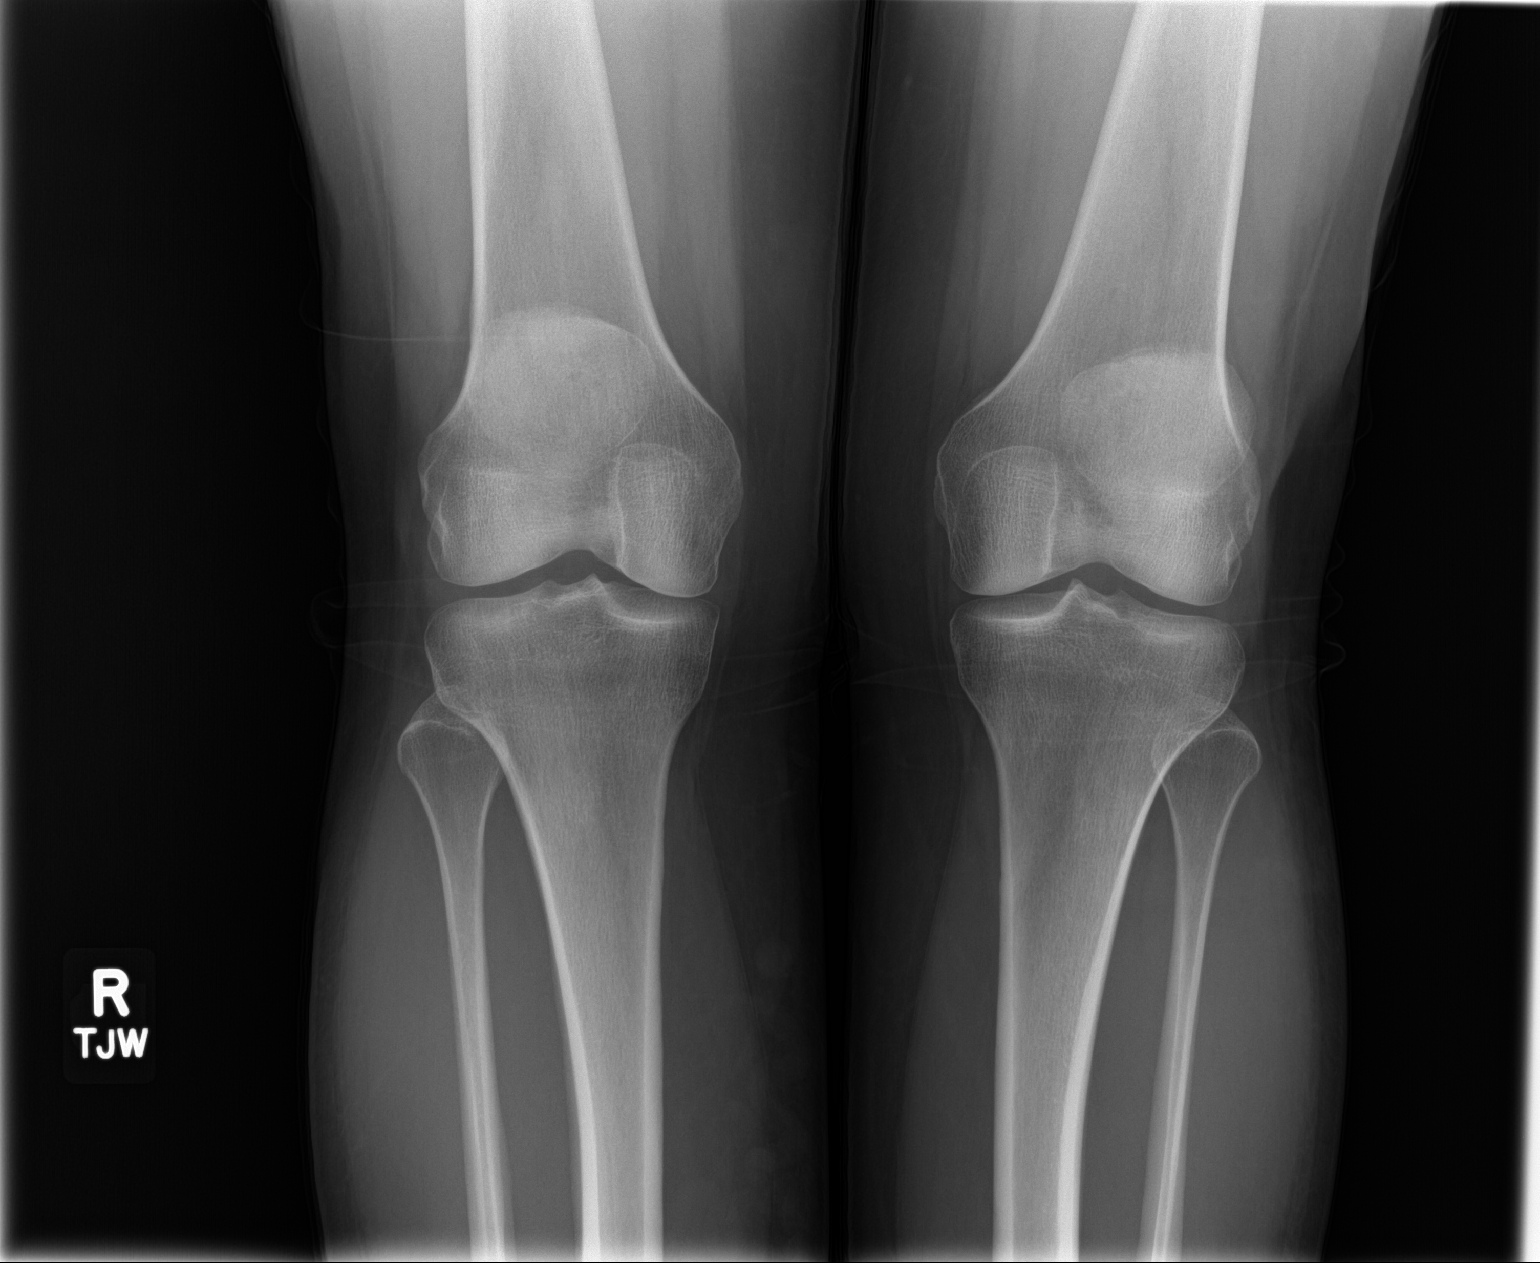

[knee lat]
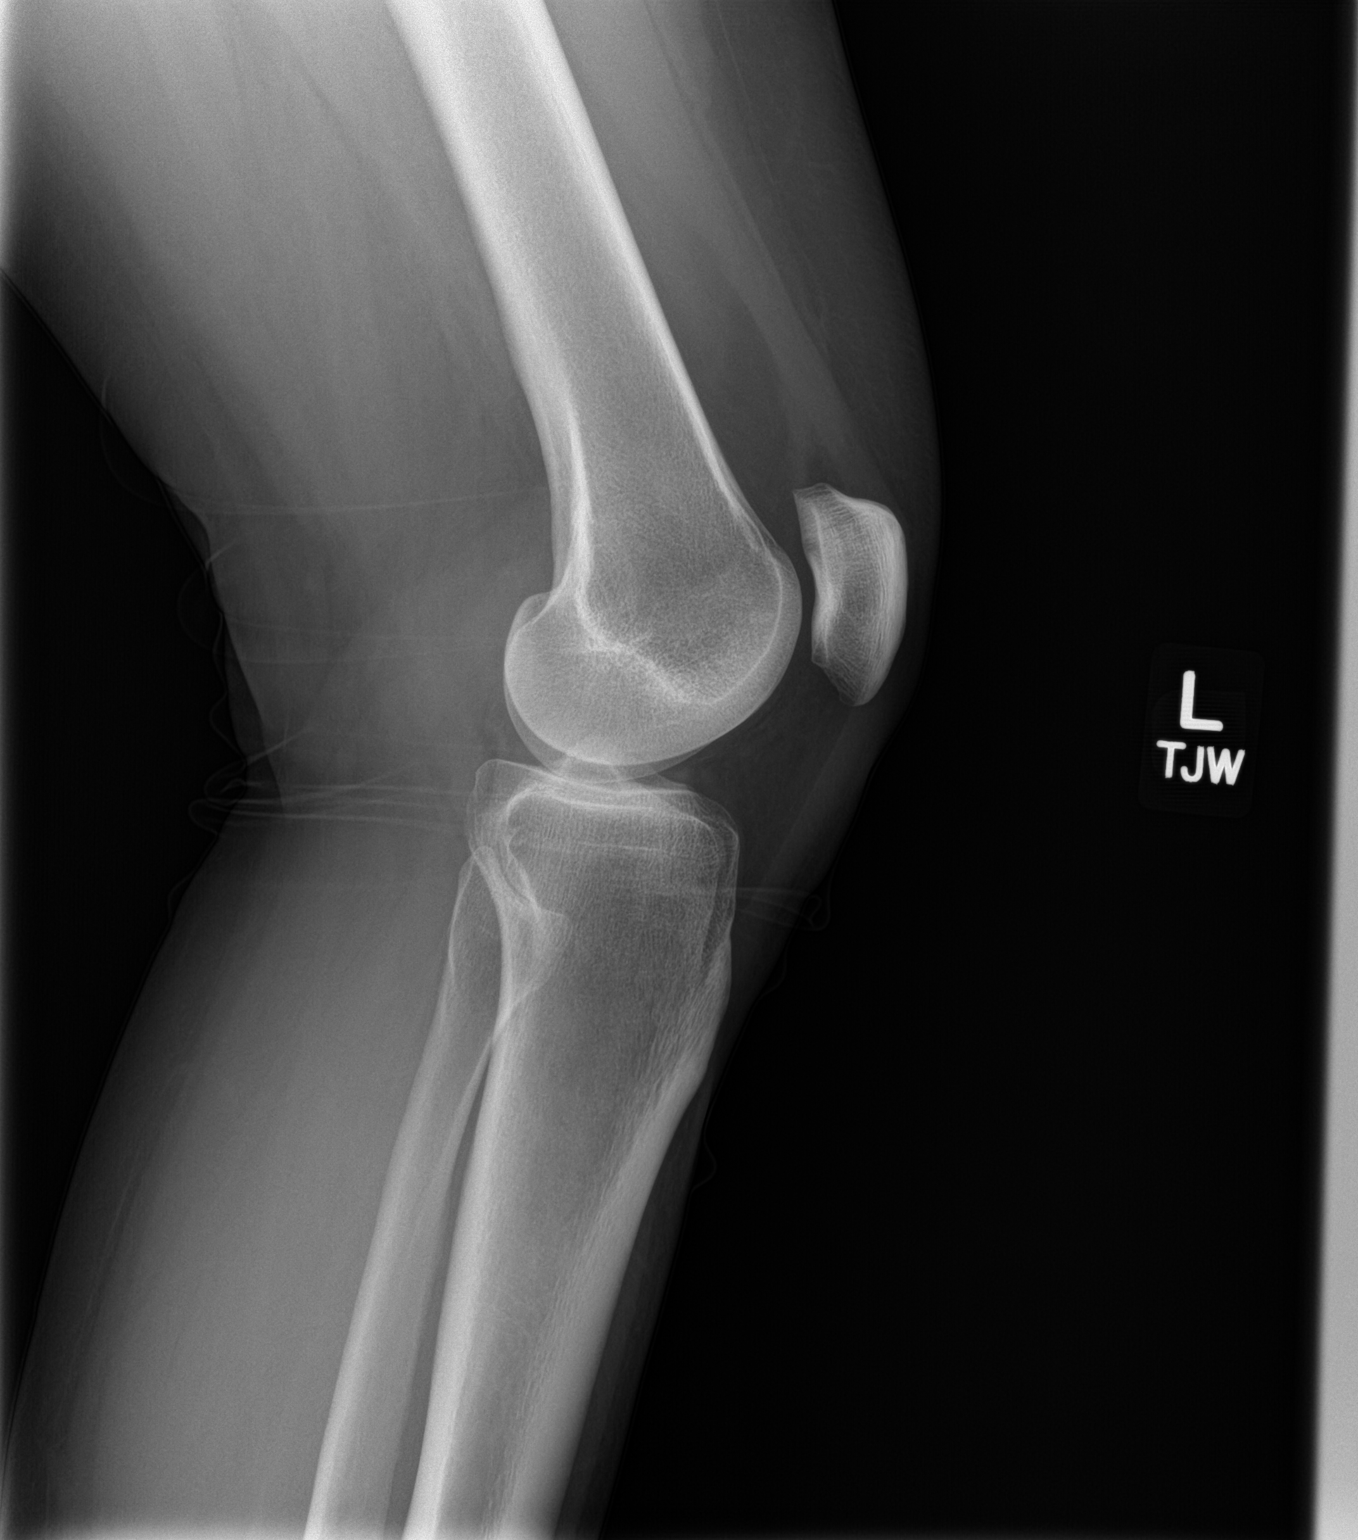

[patella skyline]
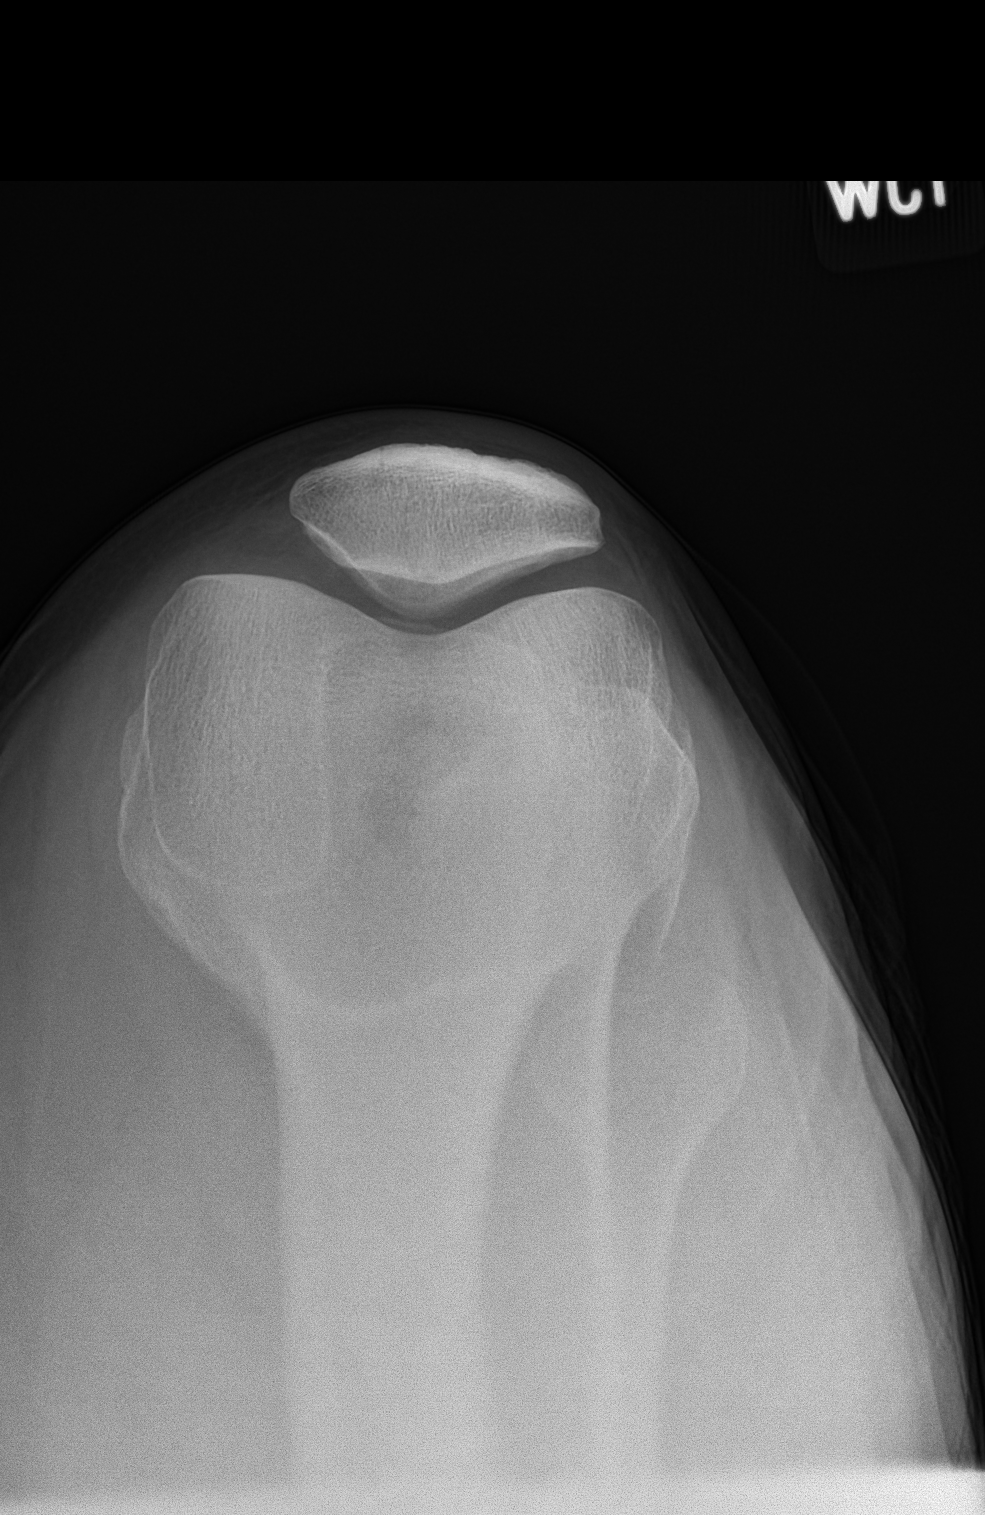

[3 of 3 positions shown; findings below may reference images not displayed]

FINDINGS: No acute fracture or dislocation. Joint spaces are maintained for
age. No joint effusion.
IMPRESSION: No acute osseous abnormality.

## 2022-07-06 IMAGING — DX DG KNEE AP/LAT W/ SUNRISE*R*
3 series · 3 of 3 positions shown · non-contrast
Comparison: None.

CLINICAL DATA: Chronic bilateral knee pain.

EXAM:
RIGHT KNEE 3 VIEWS

[knee lat]
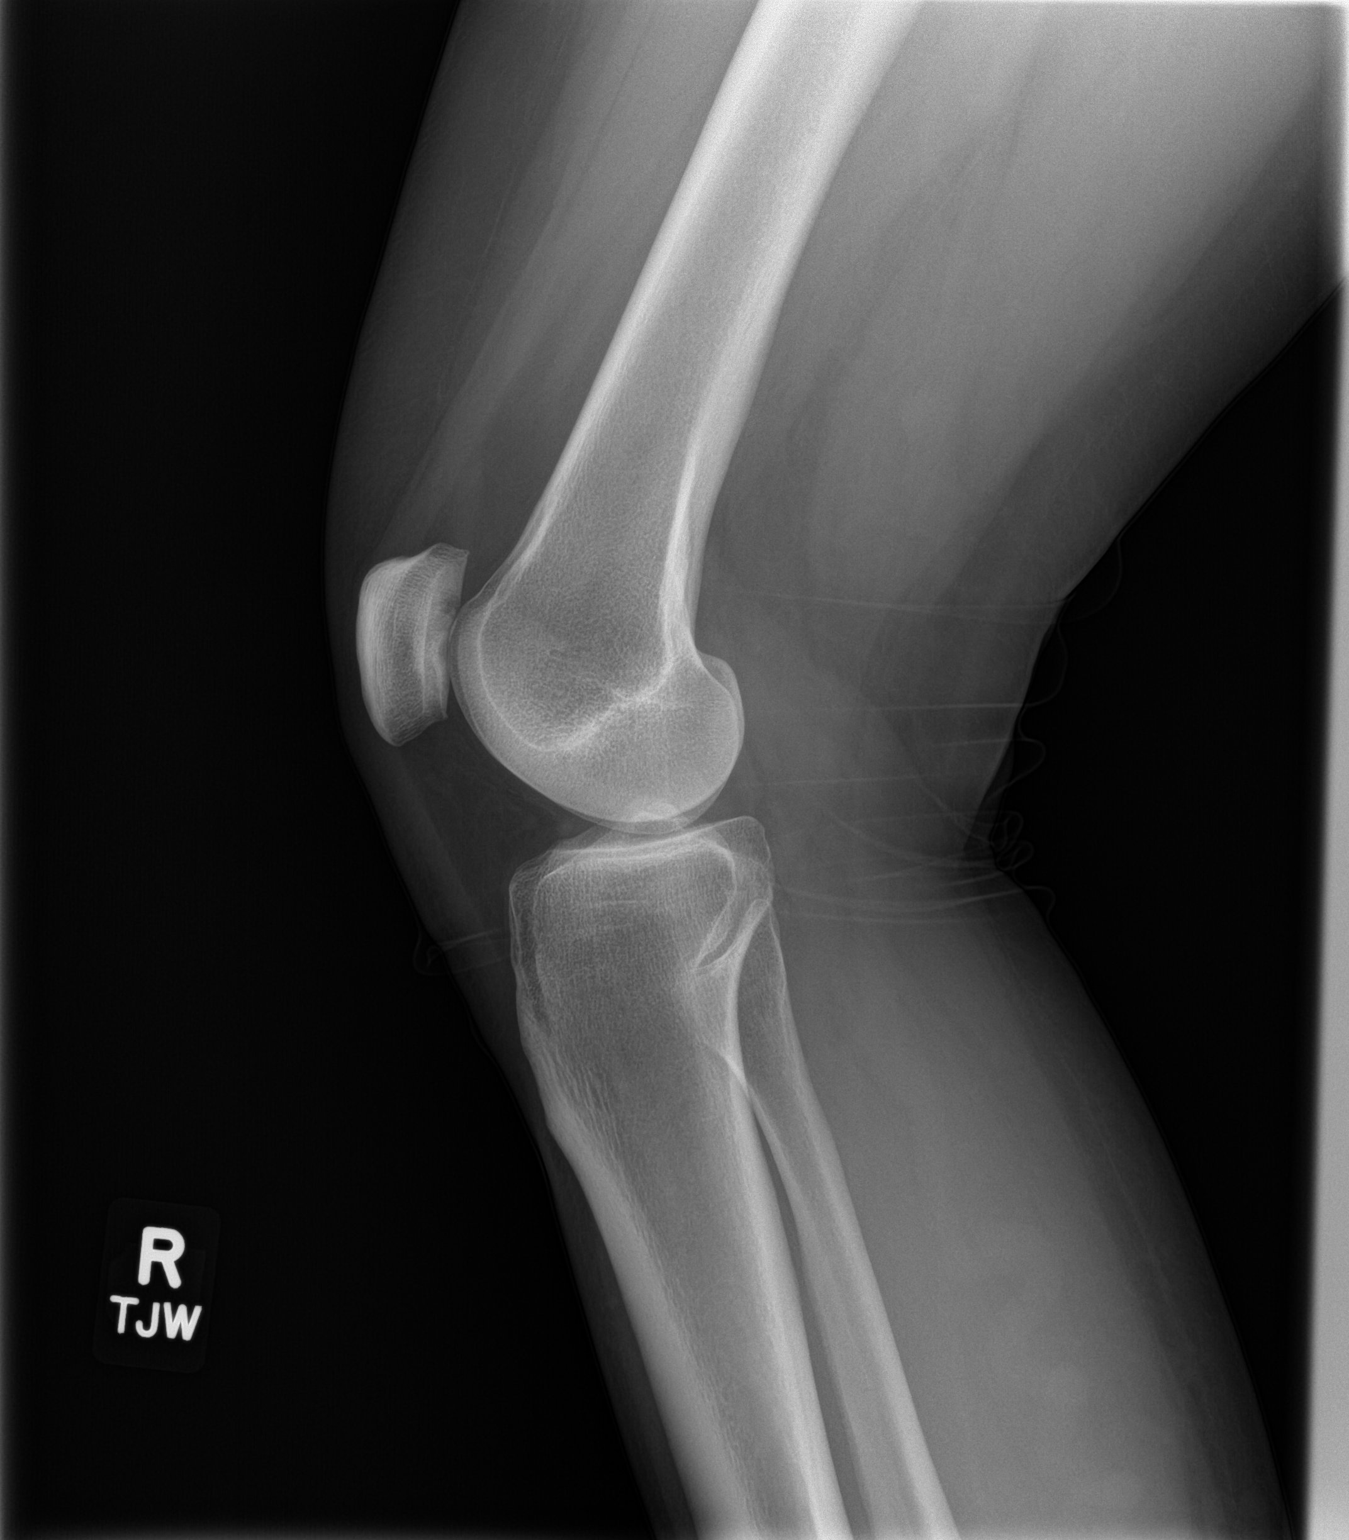

[knee ap]
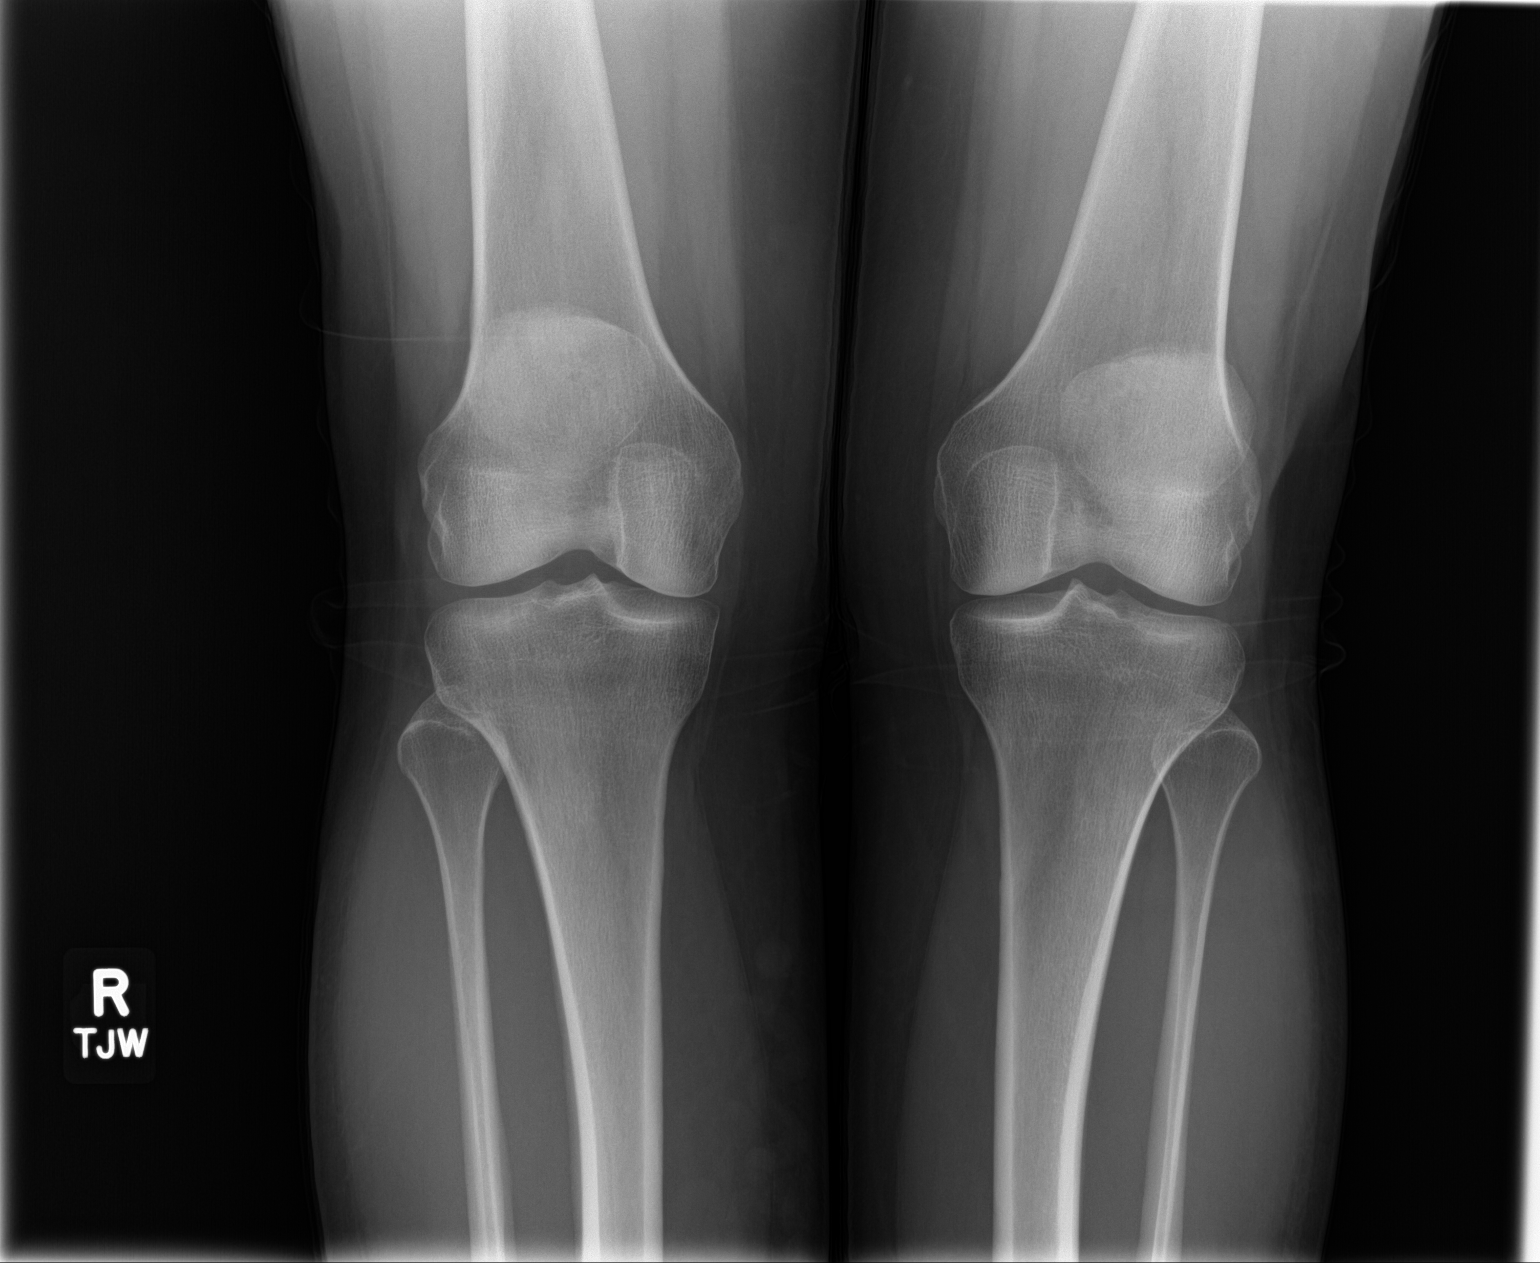

[patella skyline]
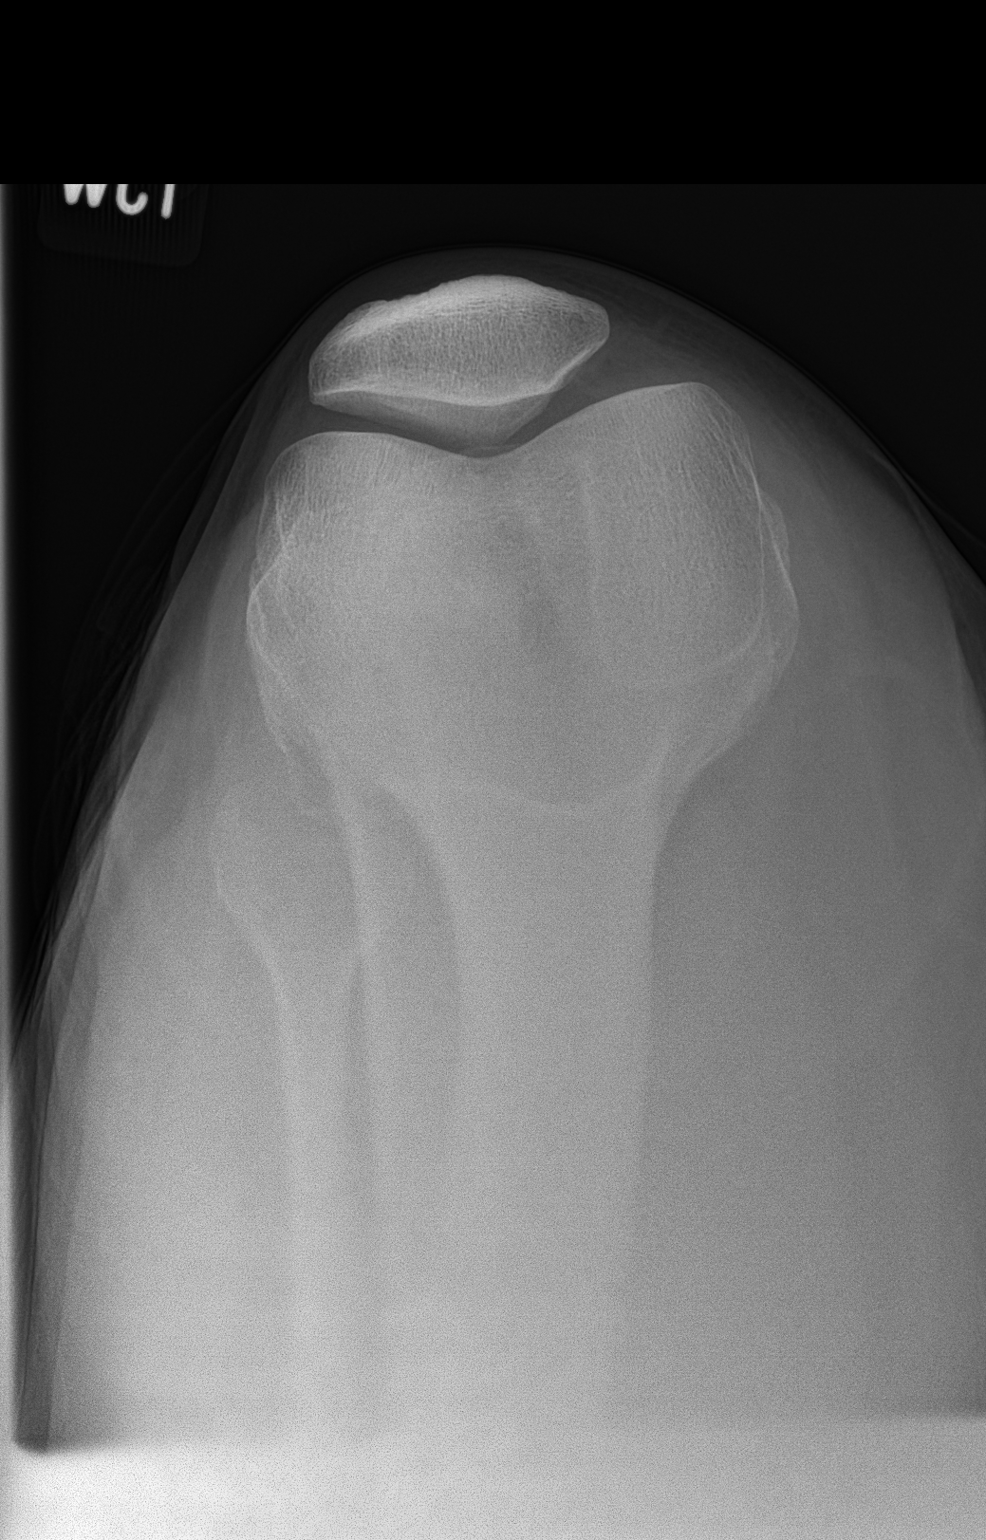

[3 of 3 positions shown; findings below may reference images not displayed]

FINDINGS: No evidence of fracture, dislocation, or joint effusion. No evidence
of arthropathy or other focal bone abnormality. Soft tissues are
unremarkable.
IMPRESSION: Negative.

## 2022-08-03 ENCOUNTER — Encounter: Payer: Self-pay | Admitting: Nurse Practitioner

## 2022-08-03 NOTE — Telephone Encounter (Signed)
Noted  

## 2022-08-03 NOTE — Telephone Encounter (Signed)
FYI: Patient called to discuss a hypoglycemic event yesterday when her CGM was warming up.  Her CGM was not communicating with her pump and was going off manual settings and she had severe low which she was able to correct with food/drink.  We decided to lower her basal rate from 0.9 to 0.7 units per hour to prevent hypoglycemia during those times she switches to automatic limited or manual mode.

## 2022-08-04 ENCOUNTER — Encounter: Payer: Self-pay | Admitting: Internal Medicine

## 2022-08-04 ENCOUNTER — Ambulatory Visit: Payer: 59 | Admitting: Internal Medicine

## 2022-08-04 VITALS — BP 108/62 | HR 105 | Temp 97.3°F | Wt 186.0 lb

## 2022-08-04 DIAGNOSIS — J020 Streptococcal pharyngitis: Secondary | ICD-10-CM

## 2022-08-04 DIAGNOSIS — R52 Pain, unspecified: Secondary | ICD-10-CM | POA: Diagnosis not present

## 2022-08-04 LAB — POCT RAPID STREP A (OFFICE): Rapid Strep A Screen: POSITIVE — AB

## 2022-08-04 LAB — POC COVID19 BINAXNOW: SARS Coronavirus 2 Ag: NEGATIVE

## 2022-08-04 LAB — POCT INFLUENZA A/B
Influenza A, POC: NEGATIVE
Influenza B, POC: NEGATIVE

## 2022-08-04 MED ORDER — AZITHROMYCIN 250 MG PO TABS: ORAL_TABLET | ORAL | 0 refills | Status: DC

## 2022-08-04 NOTE — Patient Instructions (Signed)
Strep Throat, Adult Strep throat is an infection of the throat. It is caused by germs (bacteria). Strep throat is common during the cold months of the year. It mostly affects children who are 5-45 years old. However, people of all ages can get it at any time of the year. This infection spreads from person to person through coughing, sneezing, or having close contact. What are the causes? This condition is caused by the Streptococcus pyogenes germ. What increases the risk? You care for young children. Children are more likely to get strep throat and may spread it to others. You go to crowded places. Germs can spread easily in such places. You kiss or touch someone who has strep throat. What are the signs or symptoms? Fever or chills. Redness, swelling, or pain in the tonsils or throat. Pain or trouble when swallowing. White or yellow spots on the tonsils or throat. Tender glands in the neck and under the jaw. Bad breath. Red rash all over the body. This is rare. How is this treated? Medicines that kill germs (antibiotics). Medicines that treat pain or fever. These include: Ibuprofen or acetaminophen. Aspirin, only for people who are over the age of 18. Cough drops. Throat sprays. Follow these instructions at home: Medicines  Take over-the-counter and prescription medicines only as told by your doctor. Take your antibiotic medicine as told by your doctor. Do not stop taking the antibiotic even if you start to feel better. Eating and drinking  If you have trouble swallowing, eat soft foods until your throat feels better. Drink enough fluid to keep your pee (urine) pale yellow. To help with pain, you may have: Warm fluids, such as soup and tea. Cold fluids, such as frozen desserts or popsicles. General instructions Rinse your mouth (gargle) with a salt-water mixture 3-4 times a day or as needed. To make a salt-water mixture, dissolve -1 tsp (3-6 g) of salt in 1 cup (237 mL) of warm  water. Rest as much as you can. Stay home from work or school until you have been taking antibiotics for 24 hours. Do not smoke or use any products that contain nicotine or tobacco. If you need help quitting, ask your doctor. Keep all follow-up visits. How is this prevented?  Do not share food, drinking cups, or personal items. They can cause the germs to spread. Wash your hands well with soap and water. Make sure that all people in your house wash their hands well. Have family members tested if they have a fever or a sore throat. They may need an antibiotic if they have strep throat. Contact a doctor if: You have swelling in your neck that keeps getting bigger. You get a rash, cough, or earache. You cough up a thick fluid that is green, yellow-brown, or bloody. You have pain that does not get better with medicine. Your symptoms get worse instead of getting better. You have a fever. Get help right away if: You vomit. You have a very bad headache. Your neck hurts or feels stiff. You have chest pain or are short of breath. You have drooling, very bad throat pain, or changes in your voice. Your neck is swollen, or the skin gets red and tender. Your mouth is dry, or you are peeing less than normal. You keep feeling more tired or have trouble waking up. Your joints are red or painful. These symptoms may be an emergency. Do not wait to see if the symptoms will go away. Get help right away. Call   your local emergency services (911 in the [place] Summary Strep throat is an infection of the throat. It is caused by germs (bacteria). This infection can spread from person to person through coughing, sneezing, or having close contact. Take your medicines, including antibiotics, as told by your doctor. Do not stop taking the antibiotic even if you start to feel better. To prevent the spread of germs, wash your hands well with soap and water. Have others do the same. Do not share food, drinking cups,  or personal items. Get help right away if you have a bad headache, chest pain, shortness of breath, a stiff or painful neck, or you vomit. This information is not intended to replace advice given to you by your health care provider. Make sure you discuss any questions you have with your health care provider. Document Revised: 10/06/2020 Document Reviewed: 10/06/2020 Elsevier Patient Education  2023 [employer]  

## 2022-08-04 NOTE — Progress Notes (Signed)
Subjective:    Patient ID: Ellen [name], female    DOB: 1975-08-10, 47 y.o.   MRN: [id number]  HPI  Patient presents to clinic today with complaint of sore throat and body aches.  This started today. She denies difficulty swallowing. She reports associated headache, nausea but denies vomiting. She denies runny nose, nasal congestion, ear pain, cough, shortness of breath, vomiting or diarrhea. She denies fever or chills. She has not had sick contacts that she is aware of but she has had exposure to strep. She has tried Ibuprofen OTC with minimal relief of symptoms.   Review of Systems     Past Medical History:  Diagnosis Date   DM (diabetes mellitus), type 1 (HCC)    Frequent headaches    Hyperlipidemia     Current Outpatient Medications  Medication Sig Dispense Refill   Continuous Blood Gluc Sensor (DEXCOM G6 SENSOR) MISC Change sensor every 10 days as directed 9 each 1   Continuous Blood Gluc Transmit (DEXCOM G6 TRANSMITTER) MISC Change transmitter every 90 days as directed. 1 each 0   Insulin Disposable Pump (OMNIPOD 5 G6 INTRO, GEN 5,) KIT Change pod every 48-72 hours 1 kit 0   Insulin Disposable Pump (OMNIPOD 5 G6 POD, GEN 5,) MISC Change pod every 48-72 hours 15 each 6   insulin lispro (HUMALOG) 100 UNIT/ML injection Use with Omnipod for TDD around 67 units 60 mL 3   No current facility-administered medications for this visit.    No Known Allergies  Family History  Problem Relation Age of Onset   Hyperlipidemia Mother    Cancer Father        prostate   Arthritis Maternal Grandmother    Hyperlipidemia Maternal Grandmother    Hypertension Maternal Grandmother    Stroke Maternal Grandfather    Hypertension Maternal Grandfather    Stroke Paternal Grandmother    Diabetes Paternal Grandmother     Social History   Socioeconomic History   Marital status: Married    Spouse name: Not on file   Number of children: Not on file   Years of education: Not on file   Highest  education level: Not on file  Occupational History   Not on file  Tobacco Use   Smoking status: Never   Smokeless tobacco: Never  Vaping Use   Vaping Use: Never used  Substance and Sexual Activity   Alcohol use: No   Drug use: No   Sexual activity: Yes  Other Topics Concern   Not on file  Social History Narrative   Not on file   Social Determinants of Health   Financial Resource Strain: Not on file  Food Insecurity: Not on file  Transportation Needs: Not on file  Physical Activity: Not on file  Stress: Not on file  Social Connections: Not on file  Intimate Partner Violence: Not on file     Constitutional: Patient reports headache.  Denies fever, malaise, fatigue, or abrupt weight changes.  HEENT: Patient reports sore throat.  Denies eye pain, eye redness, ear pain, ringing in the ears, wax buildup, runny nose, nasal congestion, bloody nose. Respiratory: Denies difficulty breathing, shortness of breath, cough or sputum production.   Cardiovascular: Denies chest pain, chest tightness, palpitations or swelling in the hands or feet.  Gastrointestinal: Denies abdominal pain, bloating, constipation, diarrhea or blood in the stool.  GU: Denies urgency, frequency, pain with urination, burning sensation, blood in urine, odor or discharge. Musculoskeletal: Patient reports body aches.  Denies decrease  in range of motion, difficulty with gait, or joint pain and swelling.  Skin: Denies redness, rashes, lesions or ulcercations.  Neurological: Denies dizziness, difficulty with memory, difficulty with speech or problems with balance and coordination.  Psych: Denies anxiety, depression, SI/HI.  No other specific complaints in a complete review of systems (except as listed in HPI above).  Objective:   Physical Exam   BP 108/62 (BP Location: Left Arm, Patient Position: Sitting, Cuff Size: Large)   Pulse (!) 105   Temp (!) 97.3 F (36.3 C) (Temporal)   Wt 186 lb (84.4 kg)   SpO2 100%    BMI 30.02 kg/m   Wt Readings from Last 3 Encounters:  04/29/22 188 lb 3.2 oz (85.4 kg)  01/21/22 187 lb (84.8 kg)  12/27/21 190 lb (86.2 kg)    General: Appears her stated age, obese, in NAD. Skin: Warm, dry and intact. No rashes noted. HEENT: Head: normal shape and size, no sinus tenderness noted; Eyes: sclera white, no icterus, conjunctiva pink, PERRLA and EOMs intact; Throat/Mouth: Teeth present, mucosa erythematous and moist.  White exudate noted on the right tonsillar pillar. Neck: Anterior cervical adenopathy noted on the right. Cardiovascular: Tachycardic with normal rhythm. S1,S2 noted.  No murmur, rubs or gallops noted. No JVD or BLE edema. No carotid bruits noted. Pulmonary/Chest: Normal effort and positive vesicular breath sounds. No respiratory distress. No wheezes, rales or ronchi noted.  Musculoskeletal:  No difficulty with gait.  Neurological: Alert and oriented.    BMET    Component Value Date/Time   NA 137 12/27/2021 1447   NA 140 03/21/2013 0000   K 4.6 12/27/2021 1447   CL 103 12/27/2021 1447   CO2 28 12/27/2021 1447   GLUCOSE 295 (H) 12/27/2021 1447   BUN 16 12/27/2021 1447   BUN 14 03/21/2013 0000   CREATININE 0.76 12/27/2021 1447   CALCIUM 9.1 12/27/2021 1447   GFRNONAA >60 10/18/2007 1053   GFRAA  10/18/2007 1053    >60        The eGFR has been calculated using the MDRD equation. This calculation has not been validated in all clinical    Lipid Panel     Component Value Date/Time   CHOL 186 12/27/2021 1447   TRIG 64 12/27/2021 1447   HDL 76 12/27/2021 1447   CHOLHDL 2.4 12/27/2021 1447   VLDL 8.6 04/07/2020 1126   LDLCALC 95 12/27/2021 1447    CBC    Component Value Date/Time   WBC 9.5 12/27/2021 1447   RBC 4.37 12/27/2021 1447   HGB 12.4 12/27/2021 1447   HCT 38.0 12/27/2021 1447   PLT 359 12/27/2021 1447   MCV 87.0 12/27/2021 1447   MCH 28.4 12/27/2021 1447   MCHC 32.6 12/27/2021 1447   RDW 12.5 12/27/2021 1447   LYMPHSABS 1.4  07/03/2019 1608   MONOABS 0.6 07/03/2019 1608   EOSABS 1.2 (H) 07/03/2019 1608   BASOSABS 0.1 07/03/2019 1608    Hgb A1C Lab Results  Component Value Date   HGBA1C 8.2 (A) 04/29/2022           Assessment & Plan:   Acute Headache, Sore Throat, Body Aches:  Rapid flu/COVID: Negative Rapid strep: Positive Rx for Azithromycin x 5 days Okay to continue Ibuprofen OTC as needed for pain and inflammation  RTC in 5 months for annual exam Webb Silversmith, NP

## 2022-09-02 ENCOUNTER — Ambulatory Visit (INDEPENDENT_AMBULATORY_CARE_PROVIDER_SITE_OTHER): Payer: 59 | Admitting: Nurse Practitioner

## 2022-09-02 ENCOUNTER — Encounter: Payer: Self-pay | Admitting: Nurse Practitioner

## 2022-09-02 VITALS — BP 118/79 | HR 73 | Ht 66.0 in | Wt 187.0 lb

## 2022-09-02 DIAGNOSIS — E1065 Type 1 diabetes mellitus with hyperglycemia: Secondary | ICD-10-CM | POA: Diagnosis not present

## 2022-09-02 LAB — POCT GLYCOSYLATED HEMOGLOBIN (HGB A1C): HbA1c, POC (controlled diabetic range): 6.8 % (ref 0.0–7.0)

## 2022-09-02 NOTE — Progress Notes (Signed)
Endocrinology Follow UpNote       09/02/2022, 10:23 AM   Subjective:    Patient ID: Ellen [name], female    DOB: [birth year].  Ellen [name] is being seen in follow up after being seen in consultation for management of currently uncontrolled symptomatic diabetes requested by  Jearld Fenton, NP.   Past Medical History:  Diagnosis Date   DM (diabetes mellitus), type 1 ([place])    Frequent headaches    Hyperlipidemia     Past Surgical History:  Procedure Laterality Date   CESAREAN SECTION  7/04,9/07.4/09    Social History   Socioeconomic History   Marital status: Married    Spouse name: Not on file   Number of children: Not on file   Years of education: Not on file   Highest education level: Not on file  Occupational History   Not on file  Tobacco Use   Smoking status: Never   Smokeless tobacco: Never  Vaping Use   Vaping Use: Never used  Substance and Sexual Activity   Alcohol use: No   Drug use: No   Sexual activity: Yes  Other Topics Concern   Not on file  Social History Narrative   Not on file   Social Determinants of Health   Financial Resource Strain: Not on file  Food Insecurity: Not on file  Transportation Needs: Not on file  Physical Activity: Not on file  Stress: Not on file  Social Connections: Not on file    Family History  Problem Relation Age of Onset   Hyperlipidemia Mother    Cancer Father        prostate   Arthritis Maternal Grandmother    Hyperlipidemia Maternal Grandmother    Hypertension Maternal Grandmother    Stroke Maternal Grandfather    Hypertension Maternal Grandfather    Stroke Paternal Grandmother    Diabetes Paternal Grandmother     Outpatient Encounter Medications as of 09/02/2022  Medication Sig   Continuous Blood Gluc Sensor (DEXCOM G6 SENSOR) MISC Change sensor every 10 days as directed   Continuous Blood Gluc Transmit (DEXCOM G6 TRANSMITTER) MISC  Change transmitter every 90 days as directed.   Insulin Disposable Pump (OMNIPOD 5 G6 INTRO, GEN 5,) KIT Change pod every 48-72 hours   Insulin Disposable Pump (OMNIPOD 5 G6 POD, GEN 5,) MISC Change pod every 48-72 hours   insulin lispro (HUMALOG) 100 UNIT/ML injection Use with Omnipod for TDD around 67 units   [DISCONTINUED] azithromycin (ZITHROMAX) 250 MG tablet Take 2 tabs today, then 1 tab daily x 4 days   No facility-administered encounter medications on file as of 09/02/2022.    ALLERGIES: No Known Allergies  VACCINATION STATUS: Immunization History  Administered Date(s) Administered   Influenza Split 04/27/2013   PPD Test 07/11/2017    Diabetes She presents for her follow-up diabetic visit. She has type 1 diabetes mellitus. Onset time: Diagnosed at approx age of 58. Her disease course has been improving. There are no hypoglycemic associated symptoms. There are no diabetic associated symptoms. There are no hypoglycemic complications. Symptoms are stable. There are no diabetic complications. Risk factors for coronary artery disease include diabetes mellitus. Current diabetic  treatment includes insulin pump. She is compliant with treatment all of the time. Her weight is fluctuating minimally. She is following a generally healthy diet. Meal planning includes avoidance of concentrated sweets and carbohydrate counting. She has had a previous visit with a dietitian. She participates in exercise intermittently. Her home blood glucose trend is decreasing steadily. Her overall blood glucose range is 140-180 mg/dl. (She presents today with her CGM and Omnipod showing near target glycemic profile overall.  Her POCT A1c today is 6.8%, improving from last visit of 8.2%.  Analysis of her CGM shows TIR 60%, TAR 40%, TBR 0% with a GMI of 7.5%.  ) An ACE inhibitor/angiotensin II receptor blocker is not being taken. She does not see a podiatrist.Eye exam is current.    Review of systems  Constitutional: +  Minimally fluctuating body weight,  current Body mass index is 30.18 kg/m. , no fatigue, no subjective hyperthermia, no subjective hypothermia Eyes: no blurry vision, no xerophthalmia ENT: no sore throat, no nodules palpated in throat, no dysphagia/odynophagia, no hoarseness Cardiovascular: no chest pain, no shortness of breath, no palpitations, no leg swelling Respiratory: no cough, no shortness of breath Gastrointestinal: no nausea/vomiting/diarrhea Musculoskeletal: no muscle/joint aches Skin: no rashes, no hyperemia Neurological: no tremors, no numbness, no tingling, no dizziness Psychiatric: no depression, no anxiety  Objective:     BP 118/79   Pulse 73   Ht '5\' 6"'$  (1.676 m)   Wt 187 lb (84.8 kg)   BMI 30.18 kg/m   Wt Readings from Last 3 Encounters:  09/02/22 187 lb (84.8 kg)  08/04/22 186 lb (84.4 kg)  04/29/22 188 lb 3.2 oz (85.4 kg)     BP Readings from Last 3 Encounters:  09/02/22 118/79  08/04/22 108/62  04/29/22 113/75      Physical Exam- Limited  Constitutional:  Body mass index is 30.18 kg/m. , not in acute distress, normal state of mind Eyes:  EOMI, no exophthalmos Musculoskeletal: no gross deformities, strength intact in all four extremities, no gross restriction of joint movements Skin:  no rashes, no hyperemia Neurological: no tremor with outstretched hands    CMP ( most recent) CMP     Component Value Date/Time   NA 137 12/27/2021 1447   NA 140 03/21/2013 0000   K 4.6 12/27/2021 1447   CL 103 12/27/2021 1447   CO2 28 12/27/2021 1447   GLUCOSE 295 (H) 12/27/2021 1447   BUN 16 12/27/2021 1447   BUN 14 03/21/2013 0000   CREATININE 0.76 12/27/2021 1447   CALCIUM 9.1 12/27/2021 1447   PROT 6.6 12/27/2021 1447   ALBUMIN 3.9 04/07/2020 1126   AST 11 12/27/2021 1447   ALT 12 12/27/2021 1447   ALKPHOS 62 04/07/2020 1126   BILITOT 0.4 12/27/2021 1447   GFRNONAA >60 10/18/2007 1053   GFRAA  10/18/2007 1053    >60        The eGFR has been  calculated using the MDRD equation. This calculation has not been validated in all clinical     Diabetic Labs (most recent): Lab Results  Component Value Date   HGBA1C 6.8 09/02/2022   HGBA1C 8.2 (A) 04/29/2022   HGBA1C 8.6 (H) 12/27/2021   MICROALBUR <0.2 12/27/2021   MICROALBUR <0.7 04/07/2020   MICROALBUR <0.7 10/17/2018     Lipid Panel ( most recent) Lipid Panel     Component Value Date/Time   CHOL 186 12/27/2021 1447   TRIG 64 12/27/2021 1447   HDL 76 12/27/2021 1447  CHOLHDL 2.4 12/27/2021 1447   VLDL 8.6 04/07/2020 1126   LDLCALC 95 12/27/2021 1447      Lab Results  Component Value Date   TSH 1.16 10/17/2018   TSH 1.51 10/17/2014   TSH 0.98 09/06/2013           Assessment & Plan:   1) Type 1 diabetes mellitus with hyperglycemia ([place])  She presents today with her CGM and Omnipod showing near target glycemic profile overall.  Her POCT A1c today is 6.8%, improving from last visit of 8.2%.  Analysis of her CGM shows TIR 60%, TAR 40%, TBR 0% with a GMI of 7.5%.   - KAMORIE CORY has currently uncontrolled symptomatic type 1 DM since 47 years of age.   -Recent labs reviewed.  - I had a long discussion with her about the progressive nature of diabetes and the pathology behind its complications. -her diabetes is not currently complicated but she remains at a high risk for more acute and chronic complications which include CAD, CVA, CKD, retinopathy, and neuropathy. These are all discussed in detail with her.  The following Lifestyle Medicine recommendations according to [facility]) were discussed and offered to patient and she agrees to start the journey:  A. Whole Foods, Plant-based plate comprising of fruits and vegetables, plant-based proteins, whole-grain carbohydrates was discussed in detail with the patient.   A list for source of those nutrients were also provided to the patient.  Patient will use only water or  unsweetened tea for hydration. B.  The need to stay away from risky substances including alcohol, smoking; obtaining 7 to 9 hours of restorative sleep, at least 150 minutes of moderate intensity exercise weekly, the importance of healthy social connections,  and stress reduction techniques were discussed. C.  A full color page of  Calorie density of various food groups per pound showing examples of each food groups was provided to the patient.  - Nutritional counseling repeated at each appointment due to patients tendency to fall back in to old habits.  - The patient admits there is a room for improvement in their diet and drink choices. -  Suggestion is made for the patient to avoid simple carbohydrates from their diet including Cakes, Sweet Desserts / Pastries, Ice Cream, Soda (diet and regular), Sweet Tea, Candies, Chips, Cookies, Sweet Pastries, Store Bought Juices, Alcohol in Excess of 1-2 drinks a day, Artificial Sweeteners, Coffee Creamer, and "Sugar-free" Products. This will help patient to have stable blood glucose profile and potentially avoid unintended weight gain.   - I encouraged the patient to switch to unprocessed or minimally processed complex starch and increased protein intake (animal or plant source), fruits, and vegetables.   - Patient is advised to stick to a routine mealtimes to eat 3 meals a day and avoid unnecessary snacks (to snack only to correct hypoglycemia).  - I have approached her with the following individualized plan to manage her diabetes and patient agrees:   -Given her diagnosis of type 1 diabetes, insulin is the exclusive choice of treatment for her.  The only change to her Omnipod 5 pump settings was to adjust her active insulin time to 3 hours (instead of 4) to help bring her postprandial readings down after meals faster.  -she is encouraged to continue monitoring glucose 4 times daily, before meals and before bed and to call the clinic if she has readings  less than 70 or above 200 for 3 tests in a  row.  - she is warned not to take insulin without proper monitoring per orders. - Adjustment parameters are given to her for hypo and hyperglycemia in writing.  - Specific targets for  A1c; LDL, HDL, and Triglycerides were discussed with the patient.  2) Blood Pressure /Hypertension:  her blood pressure is controlled to target without the use of antihypertensive medications.   3) Lipids/Hyperlipidemia:    Review of her recent lipid panel from 12/27/21 showed controlled LDL at 95.  She is not currently on any lipid lowering medications.    4)  Weight/Diet:  her Body mass index is 30.18 kg/m.  -  clearly complicating her diabetes care.   she is a candidate for weight loss. I discussed with her the fact that loss of 5 - 10% of her  current body weight will have the most impact on her diabetes management.  Exercise, and detailed carbohydrates information provided  -  detailed on discharge instructions.  5) Chronic Care/Health Maintenance: -she is not on on ACEI/ARB or Statin medications and is encouraged to initiate and continue to follow up with Ophthalmology, Dentist, Podiatrist at least yearly or according to recommendations, and advised to stay away from smoking. I have recommended yearly flu vaccine and pneumonia vaccine at least every 5 years; moderate intensity exercise for up to 150 minutes weekly; and sleep for at least 7 hours a day.  - she is advised to maintain close follow up with Jearld Fenton, NP for primary care needs, as well as her other providers for optimal and coordinated care.      I spent  30  minutes in the care of the patient today including review of labs from [place], Lipids, Thyroid Function, Hematology (current and previous including abstractions from other facilities); face-to-face time discussing  her blood glucose readings/logs, discussing hypoglycemia and hyperglycemia episodes and symptoms, medications doses, her options  of short and long term treatment based on the latest standards of care / guidelines;  discussion about incorporating lifestyle medicine;  and documenting the encounter. Risk reduction counseling performed per USPSTF guidelines to reduce obesity and cardiovascular risk factors.     Please refer to Patient Instructions for Blood Glucose Monitoring and Insulin/Medications Dosing Guide"  in media tab for additional information. Please  also refer to " Patient Self Inventory" in the Media  tab for reviewed elements of pertinent patient history.  Crosby Oyster participated in the discussions, expressed understanding, and voiced agreement with the above plans.  All questions were answered to her satisfaction. she is encouraged to contact clinic should she have any questions or concerns prior to her return visit.     Follow up plan: - Return in about 6 months (around 03/05/2023) for Diabetes F/U with A1c in office, No previsit labs, Bring meter and logs.   Rayetta Pigg, [facility] [address] [place], [place] [address] Phone: [phone] Fax: [phone]  09/02/2022, 10:23 AM

## 2022-09-08 ENCOUNTER — Encounter: Payer: Self-pay | Admitting: Nurse Practitioner

## 2023-01-25 ENCOUNTER — Encounter: Payer: Self-pay | Admitting: Nurse Practitioner

## 2023-02-21 ENCOUNTER — Other Ambulatory Visit: Payer: Self-pay | Admitting: Nurse Practitioner

## 2023-02-22 ENCOUNTER — Other Ambulatory Visit: Payer: Self-pay | Admitting: Nurse Practitioner

## 2023-02-23 MED ORDER — OMNIPOD 5 DEXG7G6 PODS GEN 5 MISC: 0 refills | Status: DC

## 2023-03-10 ENCOUNTER — Encounter: Payer: Self-pay | Admitting: Nurse Practitioner

## 2023-03-10 ENCOUNTER — Ambulatory Visit (INDEPENDENT_AMBULATORY_CARE_PROVIDER_SITE_OTHER): Payer: 59 | Admitting: Nurse Practitioner

## 2023-03-10 VITALS — BP 102/68 | HR 70 | Ht 66.0 in | Wt 190.6 lb

## 2023-03-10 DIAGNOSIS — E109 Type 1 diabetes mellitus without complications: Secondary | ICD-10-CM | POA: Diagnosis not present

## 2023-03-10 DIAGNOSIS — F418 Other specified anxiety disorders: Secondary | ICD-10-CM | POA: Diagnosis not present

## 2023-03-10 DIAGNOSIS — Z794 Long term (current) use of insulin: Secondary | ICD-10-CM

## 2023-03-10 DIAGNOSIS — F4323 Adjustment disorder with mixed anxiety and depressed mood: Secondary | ICD-10-CM

## 2023-03-10 LAB — POCT GLYCOSYLATED HEMOGLOBIN (HGB A1C)
Hemoglobin A1C: 7.5 % — AB (ref 4.0–5.6)
Hemoglobin A1C: 7.5 % — AB (ref 4.0–5.6)

## 2023-03-10 MED ORDER — OMNIPOD 5 DEXG7G6 PODS GEN 5 MISC: 1 refills | Status: DC

## 2023-03-10 MED ORDER — BUPROPION HCL ER (SR) 100 MG PO TB12: 100.0000 mg | ORAL_TABLET | Freq: Every day | ORAL | 1 refills | Status: DC

## 2023-03-10 MED ORDER — INSULIN LISPRO 100 UNIT/ML IJ SOLN: INTRAMUSCULAR | 3 refills | Status: DC

## 2023-03-10 NOTE — Progress Notes (Signed)
Endocrinology Follow UpNote       03/10/2023, 10:22 AM   Subjective:    Patient ID: Ellen [name], female    DOB: Jan 31, 1976.  Ellen [name] is being seen in follow up after being seen in consultation for management of currently uncontrolled symptomatic diabetes requested by  Lorre Munroe, NP.   Past Medical History:  Diagnosis Date   DM (diabetes mellitus), type 1 (HCC)    Frequent headaches    Hyperlipidemia     Past Surgical History:  Procedure Laterality Date   CESAREAN SECTION  7/04,9/07.4/09    Social History   Socioeconomic History   Marital status: Married    Spouse name: Not on file   Number of children: Not on file   Years of education: Not on file   Highest education level: Not on file  Occupational History   Not on file  Tobacco Use   Smoking status: Never   Smokeless tobacco: Never  Vaping Use   Vaping status: Never Used  Substance and Sexual Activity   Alcohol use: No   Drug use: No   Sexual activity: Yes  Other Topics Concern   Not on file  Social History Narrative   Not on file   Social Determinants of Health   Financial Resource Strain: Not on file  Food Insecurity: Not on file  Transportation Needs: Not on file  Physical Activity: Not on file  Stress: Not on file  Social Connections: Not on file    Family History  Problem Relation Age of Onset   Hyperlipidemia Mother    Cancer Father        prostate   Arthritis Maternal Grandmother    Hyperlipidemia Maternal Grandmother    Hypertension Maternal Grandmother    Stroke Maternal Grandfather    Hypertension Maternal Grandfather    Stroke Paternal Grandmother    Diabetes Paternal Grandmother     Outpatient Encounter Medications as of 03/10/2023  Medication Sig   buPROPion ER (WELLBUTRIN SR) 100 MG 12 hr tablet Take 1 tablet (100 mg total) by mouth daily.   Continuous Blood Gluc Sensor (DEXCOM G6 SENSOR) MISC  Change sensor every 10 days as directed   Continuous Blood Gluc Transmit (DEXCOM G6 TRANSMITTER) MISC Change transmitter every 90 days as directed.   Insulin Disposable Pump (OMNIPOD 5 G6 INTRO, GEN 5,) KIT Change pod every 48-72 hours   [DISCONTINUED] Insulin Disposable Pump (OMNIPOD 5 G6 PODS, GEN 5,) MISC CHANGE POD EVERY 48 TO 72 HOURS   [DISCONTINUED] insulin lispro (HUMALOG) 100 UNIT/ML injection Use with Omnipod for TDD around 67 units   Insulin Disposable Pump (OMNIPOD 5 G6 PODS, GEN 5,) MISC CHANGE POD EVERY 48 TO 72 HOURS   insulin lispro (HUMALOG) 100 UNIT/ML injection Use with Omnipod for TDD around 67 units   No facility-administered encounter medications on file as of 03/10/2023.    ALLERGIES: No Known Allergies  VACCINATION STATUS: Immunization History  Administered Date(s) Administered   Influenza Split 04/27/2013   PPD Test 07/11/2017    Diabetes She presents for her follow-up diabetic visit. She has type 1 diabetes mellitus. Onset time: Diagnosed at approx age of 35. Her disease  course has been worsening. There are no hypoglycemic associated symptoms. There are no diabetic associated symptoms. There are no hypoglycemic complications. Symptoms are stable. There are no diabetic complications. Risk factors for coronary artery disease include diabetes mellitus. Current diabetic treatment includes insulin pump. She is compliant with treatment all of the time. Her weight is fluctuating minimally. She is following a generally healthy diet. Meal planning includes avoidance of concentrated sweets and carbohydrate counting. She has had a previous visit with a dietitian. She participates in exercise intermittently. Her home blood glucose trend is fluctuating minimally. Her overall blood glucose range is 140-180 mg/dl. (She presents today with her CGM and Omnipod mostly at goal glycemic profile overall.  Her POCT A1c today is 7.5%, increasing from last visit of 6.8%.  Analysis of her CGM  shows TIR 62%, TAR 37%, TBR 1% with a GMI of 7.4%.  She notes her emotions have been fluctuating and which she has turned to food for comfort.) An ACE inhibitor/angiotensin II receptor blocker is not being taken. She does not see a podiatrist.Eye exam is current.    Review of systems  Constitutional: + increasing body weight (says she has been eating due to stress),  current Body mass index is 30.76 kg/m. , no fatigue, no subjective hyperthermia, no subjective hypothermia Eyes: no blurry vision, no xerophthalmia ENT: no sore throat, no nodules palpated in throat, no dysphagia/odynophagia, no hoarseness Cardiovascular: no chest pain, no shortness of breath, no palpitations, no leg swelling Respiratory: no cough, no shortness of breath Gastrointestinal: no nausea/vomiting/diarrhea Musculoskeletal: no muscle/joint aches Skin: no rashes, no hyperemia Neurological: no tremors, no numbness, no tingling, no dizziness Psychiatric: no depression, no anxiety  Objective:     BP 102/68 (BP Location: Left Arm, Patient Position: Sitting, Cuff Size: Large)   Pulse 70   Ht 5\' 6"  (1.676 m)   Wt 190 lb 9.6 oz (86.5 kg)   BMI 30.76 kg/m   Wt Readings from Last 3 Encounters:  03/10/23 190 lb 9.6 oz (86.5 kg)  09/02/22 187 lb (84.8 kg)  08/04/22 186 lb (84.4 kg)     BP Readings from Last 3 Encounters:  03/10/23 102/68  09/02/22 118/79  08/04/22 108/62      Physical Exam- Limited  Constitutional:  Body mass index is 30.76 kg/m. , not in acute distress, normal state of mind Eyes:  EOMI, no exophthalmos Musculoskeletal: no gross deformities, strength intact in all four extremities, no gross restriction of joint movements Skin:  no rashes, no hyperemia Neurological: no tremor with outstretched hands    CMP ( most recent) CMP     Component Value Date/Time   NA 137 12/27/2021 1447   NA 140 03/21/2013 0000   K 4.6 12/27/2021 1447   CL 103 12/27/2021 1447   CO2 28 12/27/2021 1447    GLUCOSE 295 (H) 12/27/2021 1447   BUN 16 12/27/2021 1447   BUN 14 03/21/2013 0000   CREATININE 0.76 12/27/2021 1447   CALCIUM 9.1 12/27/2021 1447   PROT 6.6 12/27/2021 1447   ALBUMIN 3.9 04/07/2020 1126   AST 11 12/27/2021 1447   ALT 12 12/27/2021 1447   ALKPHOS 62 04/07/2020 1126   BILITOT 0.4 12/27/2021 1447   GFRNONAA >60 10/18/2007 1053   GFRAA  10/18/2007 1053    >60        The eGFR has been calculated using the MDRD equation. This calculation has not been validated in all clinical     Diabetic Labs (most recent): Lab  Results  Component Value Date   HGBA1C 7.5 (A) 03/10/2023   HGBA1C 6.8 09/02/2022   HGBA1C 8.2 (A) 04/29/2022   MICROALBUR <0.2 12/27/2021   MICROALBUR <0.7 04/07/2020   MICROALBUR <0.7 10/17/2018     Lipid Panel ( most recent) Lipid Panel     Component Value Date/Time   CHOL 186 12/27/2021 1447   TRIG 64 12/27/2021 1447   HDL 76 12/27/2021 1447   CHOLHDL 2.4 12/27/2021 1447   VLDL 8.6 04/07/2020 1126   LDLCALC 95 12/27/2021 1447      Lab Results  Component Value Date   TSH 1.16 10/17/2018   TSH 1.51 10/17/2014   TSH 0.98 09/06/2013           Assessment & Plan:   1) Type 1 diabetes mellitus (HCC)  She presents today with her CGM and Omnipod mostly at goal glycemic profile overall.  Her POCT A1c today is 7.5%, increasing from last visit of 6.8%.  Analysis of her CGM shows TIR 62%, TAR 37%, TBR 1% with a GMI of 7.4%.  She notes her emotions have been fluctuating and which she has turned to food for comfort.  - Ellen [name] has currently uncontrolled symptomatic type 1 DM since 47 years of age.   -Recent labs reviewed.  - I had a long discussion with her about the progressive nature of diabetes and the pathology behind its complications. -her diabetes is not currently complicated but she remains at a high risk for more acute and chronic complications which include CAD, CVA, CKD, retinopathy, and neuropathy. These are all discussed in  detail with her.  The following Lifestyle Medicine recommendations according to American College of Lifestyle Medicine [facility]) were discussed and offered to patient and she agrees to start the journey:  A. Whole Foods, Plant-based plate comprising of fruits and vegetables, plant-based proteins, whole-grain carbohydrates was discussed in detail with the patient.   A list for source of those nutrients were also provided to the patient.  Patient will use only water or unsweetened tea for hydration. B.  The need to stay away from risky substances including alcohol, smoking; obtaining 7 to 9 hours of restorative sleep, at least 150 minutes of moderate intensity exercise weekly, the importance of healthy social connections,  and stress reduction techniques were discussed. C.  A full color page of  Calorie density of various food groups per pound showing examples of each food groups was provided to the patient.  - Nutritional counseling repeated at each appointment due to patients tendency to fall back in to old habits.  - The patient admits there is a room for improvement in their diet and drink choices. -  Suggestion is made for the patient to avoid simple carbohydrates from their diet including Cakes, Sweet Desserts / Pastries, Ice Cream, Soda (diet and regular), Sweet Tea, Candies, Chips, Cookies, Sweet Pastries, Store Bought Juices, Alcohol in Excess of 1-2 drinks a day, Artificial Sweeteners, Coffee Creamer, and "Sugar-free" Products. This will help patient to have stable blood glucose profile and potentially avoid unintended weight gain.   - I encouraged the patient to switch to unprocessed or minimally processed complex starch and increased protein intake (animal or plant source), fruits, and vegetables.   - Patient is advised to stick to a routine mealtimes to eat 3 meals a day and avoid unnecessary snacks (to snack only to correct hypoglycemia).  - I have approached her with the following  individualized plan to manage her diabetes and  patient agrees:   -Given her diagnosis of type 1 diabetes, insulin is the exclusive choice of treatment for her.  No changes were made to her pump settings today.  -she is encouraged to continue monitoring glucose 4 times daily, before meals and before bed and to call the clinic if she has readings less than 70 or above 200 for 3 tests in a row.  - she is warned not to take insulin without proper monitoring per orders. - Adjustment parameters are given to her for hypo and hyperglycemia in writing.  - Specific targets for  A1c; LDL, HDL, and Triglycerides were discussed with the patient.  2) Blood Pressure /Hypertension:  her blood pressure is controlled to target without the use of antihypertensive medications.   3) Lipids/Hyperlipidemia:    Review of her recent lipid panel from 12/27/21 showed controlled LDL at 95.  She is not currently on any lipid lowering medications.    4)  Weight/Diet:  her Body mass index is 30.76 kg/m.  -  clearly complicating her diabetes care.   she is a candidate for weight loss. I discussed with her the fact that loss of 5 - 10% of her  current body weight will have the most impact on her diabetes management.  Exercise, and detailed carbohydrates information provided  -  detailed on discharge instructions.  5) Chronic Care/Health Maintenance: -she is not on on ACEI/ARB or Statin medications and is encouraged to initiate and continue to follow up with Ophthalmology, Dentist, Podiatrist at least yearly or according to recommendations, and advised to stay away from smoking. I have recommended yearly flu vaccine and pneumonia vaccine at least every 5 years; moderate intensity exercise for up to 150 minutes weekly; and sleep for at least 7 hours a day.  6) Anxiety and depression I did start her on trial of Wellbutrin 100 mg po daily to help with her fluctuating symptoms.  I chose this medication as she is also concerned  with weight gain.  She notes she will discuss this with her OBGYN and PCP as well moving forward.  - she is advised to maintain close follow up with Lorre Munroe, NP for primary care needs, as well as her other providers for optimal and coordinated care.      I spent  41  minutes in the care of the patient today including review of labs from CMP, Lipids, Thyroid Function, Hematology (current and previous including abstractions from other facilities); face-to-face time discussing  her blood glucose readings/logs, discussing hypoglycemia and hyperglycemia episodes and symptoms, medications doses, her options of short and long term treatment based on the latest standards of care / guidelines;  discussion about incorporating lifestyle medicine;  and documenting the encounter. Risk reduction counseling performed per USPSTF guidelines to reduce obesity and cardiovascular risk factors.     Please refer to Patient Instructions for Blood Glucose Monitoring and Insulin/Medications Dosing Guide"  in media tab for additional information. Please  also refer to " Patient Self Inventory" in the Media  tab for reviewed elements of pertinent patient history.  Maggie Font participated in the discussions, expressed understanding, and voiced agreement with the above plans.  All questions were answered to her satisfaction. she is encouraged to contact clinic should she have any questions or concerns prior to her return visit.     Follow up plan: - Return in about 6 months (around 09/07/2023) for Diabetes F/U with A1c in office, No previsit labs, Bring meter and logs.  Ronny Bacon, [facility] [address] [place], [place] [address] Phone: [phone] Fax: [phone]  03/10/2023, 10:22 AM

## 2023-05-05 ENCOUNTER — Encounter: Payer: Self-pay | Admitting: Nurse Practitioner

## 2023-05-05 MED ORDER — DEXCOM G6 SENSOR MISC: 6 refills | Status: DC

## 2023-05-12 LAB — HM MAMMOGRAPHY

## 2023-05-17 ENCOUNTER — Other Ambulatory Visit: Payer: Self-pay | Admitting: Obstetrics and Gynecology

## 2023-05-17 DIAGNOSIS — R928 Other abnormal and inconclusive findings on diagnostic imaging of breast: Secondary | ICD-10-CM

## 2023-06-07 ENCOUNTER — Ambulatory Visit: Payer: 59

## 2023-06-07 ENCOUNTER — Ambulatory Visit
Admission: RE | Admit: 2023-06-07 | Discharge: 2023-06-07 | Disposition: A | Discharge status: Home / Self Care | Payer: 59 | Source: Ambulatory Visit | Attending: Obstetrics and Gynecology | Admitting: Obstetrics and Gynecology

## 2023-06-07 DIAGNOSIS — R928 Other abnormal and inconclusive findings on diagnostic imaging of breast: Secondary | ICD-10-CM

## 2023-06-16 ENCOUNTER — Encounter: Payer: Self-pay | Admitting: Nurse Practitioner

## 2023-06-24 ENCOUNTER — Other Ambulatory Visit: Payer: Self-pay | Admitting: Nurse Practitioner

## 2023-07-27 ENCOUNTER — Telehealth: Payer: Self-pay

## 2023-07-27 ENCOUNTER — Other Ambulatory Visit (HOSPITAL_COMMUNITY): Payer: Self-pay

## 2023-07-27 NOTE — Telephone Encounter (Signed)
Pharmacy Patient Advocate Encounter   Received notification from CoverMyMeds that prior authorization for Omnipods is required/requested.   Insurance verification completed.   The patient is insured through [facility] .   Per test claim: The current 30 day co-pay is, $40.  No PA needed at this time. This test claim was processed through [facility]- copay amounts may vary at other pharmacies due to pharmacy/plan contracts, or as the patient moves through the different stages of their insurance plan.     Plan covers 10 pods per month. Please advise if more is neccessary

## 2023-07-27 NOTE — Telephone Encounter (Signed)
It should be ok, pods can be worn up to 3 days depending on how much insulin she goes through.  We can give 6 refills

## 2023-07-31 NOTE — Telephone Encounter (Signed)
Patient was called and made aware. She is going to her pharmacy and pick up. I shared with her what the PA department had shared with [place]. She plans to call us if she has any trouble getting from her pharmacy and have [place] run it through a [facility].

## 2023-08-28 ENCOUNTER — Other Ambulatory Visit: Payer: Self-pay | Admitting: Nurse Practitioner

## 2023-08-28 MED ORDER — OMNIPOD 5 DEXG7G6 PODS GEN 5 MISC: 6 refills | Status: DC

## 2023-09-08 ENCOUNTER — Ambulatory Visit (INDEPENDENT_AMBULATORY_CARE_PROVIDER_SITE_OTHER): Payer: 59 | Admitting: Nurse Practitioner

## 2023-09-08 ENCOUNTER — Encounter: Payer: Self-pay | Admitting: Nurse Practitioner

## 2023-09-08 VITALS — BP 102/66 | HR 82 | Ht 66.0 in | Wt 190.4 lb

## 2023-09-08 DIAGNOSIS — E109 Type 1 diabetes mellitus without complications: Secondary | ICD-10-CM | POA: Diagnosis not present

## 2023-09-08 DIAGNOSIS — Z794 Long term (current) use of insulin: Secondary | ICD-10-CM

## 2023-09-08 LAB — POCT GLYCOSYLATED HEMOGLOBIN (HGB A1C): Hemoglobin A1C: 7 % — AB (ref 4.0–5.6)

## 2023-09-08 MED ORDER — BUPROPION HCL ER (SR) 100 MG PO TB12: 100.0000 mg | ORAL_TABLET | Freq: Every day | ORAL | 0 refills | Status: DC

## 2023-09-08 NOTE — Progress Notes (Signed)
 Endocrinology Follow UpNote       09/08/2023, 11:14 AM   Subjective:    Patient ID: Ellen [name], female    DOB: [birth year].  Ellen [name] is being seen in follow up after being seen in consultation for management of currently uncontrolled symptomatic diabetes requested by  Lorre Munroe, NP.   Past Medical History:  Diagnosis Date   DM (diabetes mellitus), type 1 (HCC)    Frequent headaches    Hyperlipidemia     Past Surgical History:  Procedure Laterality Date   CESAREAN SECTION  7/04,9/07.4/09    Social History   Socioeconomic History   Marital status: Married    Spouse name: Not on file   Number of children: Not on file   Years of education: Not on file   Highest education level: Not on file  Occupational History   Not on file  Tobacco Use   Smoking status: Never   Smokeless tobacco: Never  Vaping Use   Vaping status: Never Used  Substance and Sexual Activity   Alcohol use: No   Drug use: No   Sexual activity: Yes  Other Topics Concern   Not on file  Social History Narrative   Not on file   Social Drivers of Health   Financial Resource Strain: Not on file  Food Insecurity: Not on file  Transportation Needs: Not on file  Physical Activity: Not on file  Stress: Not on file  Social Connections: Not on file    Family History  Problem Relation Age of Onset   Hyperlipidemia Mother    Cancer Father        prostate   Arthritis Maternal Grandmother    Hyperlipidemia Maternal Grandmother    Hypertension Maternal Grandmother    Stroke Maternal Grandfather    Hypertension Maternal Grandfather    Stroke Paternal Grandmother    Diabetes Paternal Grandmother     Outpatient Encounter Medications as of 09/08/2023  Medication Sig   Insulin Disposable Pump (OMNIPOD 5 [id number] PODS GEN 5) MISC CHANGE POD EVERY 48 TO 72 HOURS   Insulin Disposable Pump (OMNIPOD 5 G6 INTRO, GEN 5,) KIT  Change pod every 48-72 hours   insulin lispro (HUMALOG) 100 UNIT/ML injection Use with Omnipod for TDD around 67 units   buPROPion ER (WELLBUTRIN SR) 100 MG 12 hr tablet Take 1 tablet (100 mg total) by mouth daily.   Continuous Blood Gluc Transmit (DEXCOM G6 TRANSMITTER) MISC Change transmitter every 90 days as directed. (Patient not taking: Reported on 09/08/2023)   Continuous Glucose Sensor (DEXCOM G6 SENSOR) MISC Change sensor every 10 days as directed (Patient not taking: Reported on 09/08/2023)   [DISCONTINUED] buPROPion ER (WELLBUTRIN SR) 100 MG 12 hr tablet Take 1 tablet (100 mg total) by mouth daily. (Patient not taking: Reported on 09/08/2023)   No facility-administered encounter medications on file as of 09/08/2023.    ALLERGIES: No Known Allergies  VACCINATION STATUS: Immunization History  Administered Date(s) Administered   Influenza Split 04/27/2013   PPD Test 07/11/2017    Diabetes She presents for her follow-up diabetic visit. She has type 1 diabetes mellitus. Onset time: Diagnosed at approx age of 48. Her  disease course has been stable. There are no hypoglycemic associated symptoms. There are no diabetic associated symptoms. There are no hypoglycemic complications. Symptoms are stable. There are no diabetic complications. Risk factors for coronary artery disease include diabetes mellitus. Current diabetic treatment includes insulin pump. She is compliant with treatment all of the time. Her weight is fluctuating minimally. She is following a generally healthy diet. Meal planning includes avoidance of concentrated sweets and carbohydrate counting. She has had a previous visit with a dietitian. She participates in exercise intermittently. Her home blood glucose trend is fluctuating minimally. Her overall blood glucose range is 140-180 mg/dl. (She presents today with her CGM and Omnipod mostly at goal glycemic profile overall.  Her POCT A1c today is 7%, improving from last visit of 7.5%.   Analysis of her CGM shows TIR 69%, TAR 31%, TBR 0%.  She notes her pod and CGM often lose connection forcing her into manual mode.) An ACE inhibitor/angiotensin II receptor blocker is not being taken. She does not see a podiatrist.Eye exam is current.    Review of systems  Constitutional: + stable body weight,  current Body mass index is 30.73 kg/m. , no fatigue, no subjective hyperthermia, no subjective hypothermia Eyes: no blurry vision, no xerophthalmia ENT: no sore throat, no nodules palpated in throat, no dysphagia/odynophagia, no hoarseness Cardiovascular: no chest pain, no shortness of breath, no palpitations, no leg swelling Respiratory: no cough, no shortness of breath Gastrointestinal: no nausea/vomiting/diarrhea Musculoskeletal: no muscle/joint aches Skin: no rashes, no hyperemia Neurological: no tremors, no numbness, no tingling, no dizziness Psychiatric: no depression, no anxiety  Objective:     BP 102/66 (BP Location: Left Arm, Patient Position: Sitting, Cuff Size: Large)   Pulse 82   Ht 5\' 6"  (1.676 m)   Wt 190 lb 6.4 oz (86.4 kg)   BMI 30.73 kg/m   Wt Readings from Last 3 Encounters:  09/08/23 190 lb 6.4 oz (86.4 kg)  03/10/23 190 lb 9.6 oz (86.5 kg)  09/02/22 187 lb (84.8 kg)     BP Readings from Last 3 Encounters:  09/08/23 102/66  03/10/23 102/68  09/02/22 118/79      Physical Exam- Limited  Constitutional:  Body mass index is 30.73 kg/m. , not in acute distress, normal state of mind Eyes:  EOMI, no exophthalmos Musculoskeletal: no gross deformities, strength intact in all four extremities, no gross restriction of joint movements Skin:  no rashes, no hyperemia Neurological: no tremor with outstretched hands    CMP ( most recent) CMP     Component Value Date/Time   NA 137 12/27/2021 1447   NA 140 03/21/2013 0000   K 4.6 12/27/2021 1447   CL 103 12/27/2021 1447   CO2 28 12/27/2021 1447   GLUCOSE 295 (H) 12/27/2021 1447   BUN 16 12/27/2021  1447   BUN 14 03/21/2013 0000   CREATININE 0.76 12/27/2021 1447   CALCIUM 9.1 12/27/2021 1447   PROT 6.6 12/27/2021 1447   ALBUMIN 3.9 04/07/2020 1126   AST 11 12/27/2021 1447   ALT 12 12/27/2021 1447   ALKPHOS 62 04/07/2020 1126   BILITOT 0.4 12/27/2021 1447   GFRNONAA >60 10/18/2007 1053   GFRAA  10/18/2007 1053    >60        The eGFR has been calculated using the MDRD equation. This calculation has not been validated in all clinical     Diabetic Labs (most recent): Lab Results  Component Value Date   HGBA1C 7.0 (A) 09/08/2023  HGBA1C 7.5 (A) 03/10/2023   HGBA1C 7.5 (A) 03/10/2023   MICROALBUR <0.2 12/27/2021   MICROALBUR <0.7 04/07/2020   MICROALBUR <0.7 10/17/2018     Lipid Panel ( most recent) Lipid Panel     Component Value Date/Time   CHOL 186 12/27/2021 1447   TRIG 64 12/27/2021 1447   HDL 76 12/27/2021 1447   CHOLHDL 2.4 12/27/2021 1447   VLDL 8.6 04/07/2020 1126   LDLCALC 95 12/27/2021 1447      Lab Results  Component Value Date   TSH 1.16 10/17/2018   TSH 1.51 10/17/2014   TSH 0.98 09/06/2013           Assessment & Plan:   1) Type 1 diabetes mellitus (HCC)  She presents today with her CGM and Omnipod mostly at goal glycemic profile overall.  Her POCT A1c today is 7%, improving from last visit of 7.5%.  Analysis of her CGM shows TIR 69%, TAR 31%, TBR 0%.  She notes her pod and CGM often lose connection forcing her into manual mode.  - Ellen [name] has currently uncontrolled symptomatic type 1 DM since 48 years of age.   -Recent labs reviewed.  - I had a long discussion with her about the progressive nature of diabetes and the pathology behind its complications. -her diabetes is not currently complicated but she remains at a high risk for more acute and chronic complications which include CAD, CVA, CKD, retinopathy, and neuropathy. These are all discussed in detail with her.  The following Lifestyle Medicine recommendations according to  American College of Lifestyle Medicine [facility] were discussed and offered to patient and she agrees to start the journey:  A. Whole Foods, Plant-based plate comprising of fruits and vegetables, plant-based proteins, whole-grain carbohydrates was discussed in detail with the patient.   A list for source of those nutrients were also provided to the patient.  Patient will use only water or unsweetened tea for hydration. B.  The need to stay away from risky substances including alcohol, smoking; obtaining 7 to 9 hours of restorative sleep, at least 150 minutes of moderate intensity exercise weekly, the importance of healthy social connections,  and stress reduction techniques were discussed. C.  A full color page of  Calorie density of various food groups per pound showing examples of each food groups was provided to the patient.  - Nutritional counseling repeated at each appointment due to patients tendency to fall back in to old habits.  - The patient admits there is a room for improvement in their diet and drink choices. -  Suggestion is made for the patient to avoid simple carbohydrates from their diet including Cakes, Sweet Desserts / Pastries, Ice Cream, Soda (diet and regular), Sweet Tea, Candies, Chips, Cookies, Sweet Pastries, Store Bought Juices, Alcohol in Excess of 1-2 drinks a day, Artificial Sweeteners, Coffee Creamer, and "Sugar-free" Products. This will help patient to have stable blood glucose profile and potentially avoid unintended weight gain.   - I encouraged the patient to switch to unprocessed or minimally processed complex starch and increased protein intake (animal or plant source), fruits, and vegetables.   - Patient is advised to stick to a routine mealtimes to eat 3 meals a day and avoid unnecessary snacks (to snack only to correct hypoglycemia).  - I have approached her with the following individualized plan to manage her diabetes and patient agrees:   -Given her diagnosis of  type 1 diabetes, insulin is the exclusive choice of treatment for  her.  No changes were made to her pump settings today.  -she is encouraged to continue monitoring glucose 4 times daily, before meals and before bed and to call the clinic if she has readings less than 70 or above 200 for 3 tests in a row.  - she is warned not to take insulin without proper monitoring per orders. - Adjustment parameters are given to her for hypo and hyperglycemia in writing.  - Specific targets for  A1c; LDL, HDL, and Triglycerides were discussed with the patient.  2) Blood Pressure /Hypertension:  her blood pressure is controlled to target without the use of antihypertensive medications.   3) Lipids/Hyperlipidemia:    Review of her recent lipid panel from 12/27/21 showed controlled LDL at 95.  She is not currently on any lipid lowering medications.    4)  Weight/Diet:  her Body mass index is 30.73 kg/m.  -  clearly complicating her diabetes care.   she is a candidate for weight loss. I discussed with her the fact that loss of 5 - 10% of her  current body weight will have the most impact on her diabetes management.  Exercise, and detailed carbohydrates information provided  -  detailed on discharge instructions.  5) Chronic Care/Health Maintenance: -she is not on on ACEI/ARB or Statin medications and is encouraged to initiate and continue to follow up with Ophthalmology, Dentist, Podiatrist at least yearly or according to recommendations, and advised to stay away from smoking. I have recommended yearly flu vaccine and pneumonia vaccine at least every 5 years; moderate intensity exercise for up to 150 minutes weekly; and sleep for at least 7 hours a day.  6) Anxiety and depression I did start her on trial of Wellbutrin 100 mg po daily to help with her fluctuating symptoms.  She notes she did try this and felt really good while taking it.  She did see her OBGYN who chose a different medication for her to take  situationally, which ended up making her sleepy. I chose Wellbutrin as it does tend to help curb appetite and thus has off label indication for weight loss aid.  I restarted her on this today and after a month, will plan to increase to a more therapeutic dose.  - she is advised to maintain close follow up with Lorre Munroe, NP for primary care needs, as well as her other providers for optimal and coordinated care.      I spent  32  minutes in the care of the patient today including review of labs from CMP, Lipids, Thyroid Function, Hematology (current and previous including abstractions from other facilities); face-to-face time discussing  her blood glucose readings/logs, discussing hypoglycemia and hyperglycemia episodes and symptoms, medications doses, her options of short and long term treatment based on the latest standards of care / guidelines;  discussion about incorporating lifestyle medicine;  and documenting the encounter. Risk reduction counseling performed per USPSTF guidelines to reduce obesity and cardiovascular risk factors.     Please refer to Patient Instructions for Blood Glucose Monitoring and Insulin/Medications Dosing Guide"  in media tab for additional information. Please  also refer to " Patient Self Inventory" in the Media  tab for reviewed elements of pertinent patient history.  Ellen [name] participated in the discussions, expressed understanding, and voiced agreement with the above plans.  All questions were answered to her satisfaction. she is encouraged to contact clinic should she have any questions or concerns prior to her return visit.  Follow up plan: - Return in about 6 months (around 03/10/2024) for Diabetes F/U with A1c in office, No previsit labs, Bring meter and logs.   Ronny Bacon, [facility] [address] [place], [place] [address] Phone: [phone] Fax: [phone]  09/08/2023, 11:14 AM

## 2023-09-20 ENCOUNTER — Encounter: Payer: Self-pay | Admitting: Nurse Practitioner

## 2023-09-29 ENCOUNTER — Ambulatory Visit: Admitting: Internal Medicine

## 2023-09-29 ENCOUNTER — Encounter: Payer: Self-pay | Admitting: Internal Medicine

## 2023-09-29 VITALS — BP 108/68 | Ht 66.0 in | Wt 192.2 lb

## 2023-09-29 DIAGNOSIS — R310 Gross hematuria: Secondary | ICD-10-CM

## 2023-09-29 DIAGNOSIS — R8290 Unspecified abnormal findings in urine: Secondary | ICD-10-CM | POA: Diagnosis not present

## 2023-09-29 DIAGNOSIS — R350 Frequency of micturition: Secondary | ICD-10-CM | POA: Diagnosis not present

## 2023-09-29 LAB — POCT URINE DIPSTICK
Bilirubin, UA: NEGATIVE
Glucose, UA: 250 mg/dL — AB
Ketones, POC UA: NEGATIVE mg/dL
Nitrite, UA: NEGATIVE
POC PROTEIN,UA: NEGATIVE
Spec Grav, UA: <=1.005 — AB (ref 1.010–1.025)
Urobilinogen, UA: 0.2 U/dL
pH, UA: 6 (ref 5.0–8.0)

## 2023-09-29 MED ORDER — NITROFURANTOIN MONOHYD MACRO 100 MG PO CAPS: 100.0000 mg | ORAL_CAPSULE | Freq: Two times a day (BID) | ORAL | 0 refills | Status: DC

## 2023-09-29 NOTE — Progress Notes (Signed)
 Subjective:    Patient ID: Ellen [name], female    DOB: [birth year], 48 y.o.   MRN: [id number]  HPI  Discussed the use of AI scribe software for clinical note transcription with the patient, who gave verbal consent to proceed.   Ellen [name] is a 48 year old female who presents with urinary frequency and possible urinary tract infection.  She has been experiencing urinary frequency since Wednesday, following a trip out of town. There is a sensation of heaviness and the need to urinate frequently, often resulting in only a small amount of urine being passed. Hematuria is present, which she attributes to her menstrual period, noting that it was less today than previously. There is a strong odor in her urine, particularly noticeable in the morning. No vaginal discharge, itching, or odor.  Her menstrual period started on Sunday, and on Tuesday night, she used a garden [employer], which she suspects might have contributed to her symptoms.  She began taking amoxicillin on Wednesday night, but feels it may not be strong enough to address her symptoms. She has not had a urinary tract infection in a long time and is concerned about managing her symptoms over the weekend.       Review of Systems   Past Medical History:  Diagnosis Date   DM (diabetes mellitus), type 1 (HCC)    Frequent headaches    Hyperlipidemia     Current Outpatient Medications  Medication Sig Dispense Refill   buPROPion ER (WELLBUTRIN SR) 100 MG 12 hr tablet Take 1 tablet (100 mg total) by mouth daily. 30 tablet 0   Continuous Blood Gluc Transmit (DEXCOM G6 TRANSMITTER) MISC Change transmitter every 90 days as directed. (Patient not taking: Reported on 09/08/2023) 1 each 0   Continuous Glucose Sensor (DEXCOM G6 SENSOR) MISC Change sensor every 10 days as directed (Patient not taking: Reported on 09/08/2023) 3 each 6   Insulin Disposable Pump (OMNIPOD 5 DEXG7G6 PODS GEN 5) MISC CHANGE POD EVERY 48 TO 72 HOURS 15 each 6    Insulin Disposable Pump (OMNIPOD 5 G6 INTRO, GEN 5,) KIT Change pod every 48-72 hours 1 kit 0   insulin lispro (HUMALOG) 100 UNIT/ML injection Use with Omnipod for TDD around 67 units 60 mL 3   No current facility-administered medications for this visit.    No Known Allergies  Family History  Problem Relation Age of Onset   Hyperlipidemia Mother    Cancer Father        prostate   Arthritis Maternal Grandmother    Hyperlipidemia Maternal Grandmother    Hypertension Maternal Grandmother    Stroke Maternal Grandfather    Hypertension Maternal Grandfather    Stroke Paternal Grandmother    Diabetes Paternal Grandmother     Social History   Socioeconomic History   Marital status: Married    Spouse name: Not on file   Number of children: Not on file   Years of education: Not on file   Highest education level: Not on file  Occupational History   Not on file  Tobacco Use   Smoking status: Never   Smokeless tobacco: Never  Vaping Use   Vaping status: Never Used  Substance and Sexual Activity   Alcohol use: No   Drug use: No   Sexual activity: Yes  Other Topics Concern   Not on file  Social History Narrative   Not on file   Social Drivers of Health   Financial Resource Strain: Not  on file  Food Insecurity: Not on file  Transportation Needs: Not on file  Physical Activity: Not on file  Stress: Not on file  Social Connections: Not on file  Intimate Partner Violence: Not on file     Constitutional: Denies fever, malaise, fatigue, headache or abrupt weight changes.  Respiratory: Denies difficulty breathing, shortness of breath, cough or sputum production.   Cardiovascular: Denies chest pain, chest tightness, palpitations or swelling in the hands or feet.  Gastrointestinal: Denies abdominal pain, bloating, constipation, diarrhea or blood in the stool.  GU: Pt reports urinary frequency, blood in her urine, urine odor Denies urgency, pain with urination, burning sensation,  or discharge. Skin: Denies redness, rashes, lesions or ulcercations.    No other specific complaints in a complete review of systems (except as listed in HPI above).      Objective:   Physical Exam  BP 108/68 (BP Location: Left Arm, Patient Position: Sitting, Cuff Size: Normal)   Ht 5\' 6"  (1.676 m)   Wt 192 lb 3.2 oz (87.2 kg)   LMP 09/25/2023 (Approximate)   BMI 31.02 kg/m   Wt Readings from Last 3 Encounters:  09/08/23 190 lb 6.4 oz (86.4 kg)  03/10/23 190 lb 9.6 oz (86.5 kg)  09/02/22 187 lb (84.8 kg)    General: Appears her stated age, in in NAD. Cardiovascular: Normal rate. Pulmonary/Chest: Normal effort. Abdomen: Nontender.  No CVA tenderness noted. Neurological: Alert and oriented.  BMET    Component Value Date/Time   NA 137 12/27/2021 1447   NA 140 03/21/2013 0000   K 4.6 12/27/2021 1447   CL 103 12/27/2021 1447   CO2 28 12/27/2021 1447   GLUCOSE 295 (H) 12/27/2021 1447   BUN 16 12/27/2021 1447   BUN 14 03/21/2013 0000   CREATININE 0.76 12/27/2021 1447   CALCIUM 9.1 12/27/2021 1447   GFRNONAA >60 10/18/2007 1053   GFRAA  10/18/2007 1053    >60        The eGFR has been calculated using the MDRD equation. This calculation has not been validated in all clinical    Lipid Panel     Component Value Date/Time   CHOL 186 12/27/2021 1447   TRIG 64 12/27/2021 1447   HDL 76 12/27/2021 1447   CHOLHDL 2.4 12/27/2021 1447   VLDL 8.6 04/07/2020 1126   LDLCALC 95 12/27/2021 1447    CBC    Component Value Date/Time   WBC 9.5 12/27/2021 1447   RBC 4.37 12/27/2021 1447   HGB 12.4 12/27/2021 1447   HCT 38.0 12/27/2021 1447   PLT 359 12/27/2021 1447   MCV 87.0 12/27/2021 1447   MCH 28.4 12/27/2021 1447   MCHC 32.6 12/27/2021 1447   RDW 12.5 12/27/2021 1447   LYMPHSABS 1.4 07/03/2019 1608   MONOABS 0.6 07/03/2019 1608   EOSABS 1.2 (H) 07/03/2019 1608   BASOSABS 0.1 07/03/2019 1608    Hgb A1C Lab Results  Component Value Date   HGBA1C 7.0 (A)  09/08/2023            Assessment & Plan:   Assessment and Plan    Urinary Tract Infection (UTI) Symptoms consistent with UTI. Hematuria likely menstrual. Symptoms post-jacuzzi exposure.  Urinalysis abnormal. - Prescribe Macrobid 100 mg twice daily for 5 days. - Encourage increased fluid intake. - Send urine culture and follow up on results. - Contact her on Monday with urine culture results.   Schedule an appointment for your annual exam Nicki Reaper, NP

## 2023-09-29 NOTE — Patient Instructions (Signed)

## 2023-10-01 LAB — URINE CULTURE
MICRO NUMBER:: 16291336
Result:: NO GROWTH
SPECIMEN QUALITY:: ADEQUATE

## 2023-10-02 ENCOUNTER — Encounter: Payer: Self-pay | Admitting: Internal Medicine

## 2023-11-15 ENCOUNTER — Encounter: Admitting: Internal Medicine

## 2023-11-27 ENCOUNTER — Ambulatory Visit (INDEPENDENT_AMBULATORY_CARE_PROVIDER_SITE_OTHER): Admitting: Family Medicine

## 2023-11-27 ENCOUNTER — Ambulatory Visit: Payer: Self-pay

## 2023-11-27 ENCOUNTER — Encounter: Payer: Self-pay | Admitting: Family Medicine

## 2023-11-27 VITALS — BP 120/76 | HR 82 | Ht 66.0 in | Wt 189.4 lb

## 2023-11-27 DIAGNOSIS — Z794 Long term (current) use of insulin: Secondary | ICD-10-CM

## 2023-11-27 DIAGNOSIS — N3001 Acute cystitis with hematuria: Secondary | ICD-10-CM | POA: Diagnosis not present

## 2023-11-27 DIAGNOSIS — R300 Dysuria: Secondary | ICD-10-CM

## 2023-11-27 DIAGNOSIS — B379 Candidiasis, unspecified: Secondary | ICD-10-CM | POA: Diagnosis not present

## 2023-11-27 DIAGNOSIS — E1065 Type 1 diabetes mellitus with hyperglycemia: Secondary | ICD-10-CM

## 2023-11-27 LAB — POCT URINALYSIS DIPSTICK
Bilirubin, UA: NEGATIVE
Glucose, UA: NEGATIVE
Nitrite, UA: NEGATIVE
Odor: POSITIVE
Protein, UA: NEGATIVE
Spec Grav, UA: 1.015 (ref 1.010–1.025)
Urobilinogen, UA: 0.2 U/dL
pH, UA: 5 (ref 5.0–8.0)

## 2023-11-27 MED ORDER — FLUCONAZOLE 150 MG PO TABS: ORAL_TABLET | ORAL | 0 refills | Status: DC

## 2023-11-27 MED ORDER — CEPHALEXIN 500 MG PO CAPS
500.0000 mg | ORAL_CAPSULE | Freq: Three times a day (TID) | ORAL | 0 refills | Status: DC
Start: 2023-11-27 — End: 2024-03-15

## 2023-11-27 NOTE — Patient Instructions (Addendum)
Thank you for coming to the office today.  1. You have a Urinary Tract Infection - this is very common, your symptoms are reassuring and you should get better within 1 week on the antibiotics - Start Keflex 500mg 3 times daily for next 7 days, complete entire course, even if feeling better - We sent urine for a culture, we will call you within next few days if we need to change antibiotics - Please drink plenty of fluids, improve hydration over next 1 week  If symptoms worsening, developing nausea / vomiting, worsening back pain, fevers / chills / sweats, then please return for re-evaluation sooner.  If you take AZO OTC - limit this to 2-3 days MAX to avoid affecting kidneys  D-Mannose is a natural supplement that can actually help bind to urinary bacteria and reduce their effectiveness it can help prevent UTI from forming, and may reduce some symptoms. It likely cannot cure an active UTI but it is worth a try and good to prevent them with. Try 500mg twice a day at a full dose if you want, or check package instructions for more info   Please schedule a Follow-up Appointment to: Return if symptoms worsen or fail to improve.  If you have any other questions or concerns, please feel free to call the office or send a message through MyChart. You may also schedule an earlier appointment if necessary.  Additionally, you may be receiving a survey about your experience at our office within a few days to 1 week by e-mail or mail. We value your feedback.  Nikoleta Dady, DO [facility], [facility] 

## 2023-11-27 NOTE — Telephone Encounter (Signed)
  Chief Complaint: foul smelling urine Symptoms: cloudy urine, headache Frequency: Sunday  Pertinent Negatives: Patient denies fever Disposition: [] ED /[] Urgent Care (no appt availability in office) / [x] Appointment(In office/virtual)/ []  [facility]/ [] Home Care/ [] Refused Recommended Disposition /[] [facility]/ []  Follow-up with PCP Additional Notes: pt wanting earlier appt. None seen, called office and CAL stated there was an appt for today with Dr. Linnell Richardson. Not seen on my interface. Office scheduled appt for pt today at 1400. Copied from CRM [id number]. Topic: Clinical - Red Word Triage >> Nov 27, 2023  8:27 AM Everlene Hobby D wrote: Having symptoms of UTI or bladder infection since last Sunday. Has a constant headache and cloudy urine and a weird smell. Says over the weekend it has gotten worse. Reason for Disposition  Bad or foul-smelling urine  Answer Assessment - Initial Assessment Questions 1. SYMPTOM: "What's the main symptom you're concerned about?" (e.g., frequency, incontinence)     Foul odor 2. ONSET: "When did the  sx  start?"     Sunday  3. PAIN: "Is there any pain?" If Yes, ask: "How bad is it?" (Scale: 1-10; mild, moderate, severe)     mild 4. CAUSE: "What do you think is causing the symptoms?"     ? E coli  5. OTHER SYMPTOMS: "Do you have any other symptoms?" (e.g., blood in urine, fever, flank pain, pain with urination)     Strong odor, cloudy, headache  6. PREGNANCY: "Is there any chance you are pregnant?" "When was your last menstrual period?"     N/a  Protocols used: Urinary Symptoms-A-AH

## 2023-11-27 NOTE — Progress Notes (Signed)
 Subjective:    Patient ID: Ellen [name], female    DOB: August 05, 1975, 48 y.o.   MRN: [id number]  Ellen [name] is a 48 y.o. female presenting on 11/27/2023 for Urinary Tract Infection  Patient presents for a same day appointment.  PCP Helayne Lo, FNP   HPI  Discussed the use of AI scribe software for clinical note transcription with the patient, who gave verbal consent to proceed.  History of Present Illness   Ellen [name] is a 48 year old female with type 1 diabetes who presents with symptoms suggestive of a urinary tract infection.  She has been experiencing symptoms suggestive of a urinary tract infection since this past Sunday, coinciding with the start of her menstrual period. The symptoms have been intermittent but are currently intense, including frequent urination and a headache. She has a history of recurrent urinary tract infections, with the last episode occurring approximately two months ago. During that episode, she initiated antibiotic treatment while out of town, which may have affected the urine culture results. She was treated with Macrobid  09/2023 last time and it did resolve.  In the past, her urine cultures have sometimes shown E. coli growth despite initial negative dipstick results. Her symptoms often improve with treatment, even when initial tests are inconclusive. She has not started any treatment for the current episode, hoping to obtain more accurate diagnostic results.  She has type 1 diabetes and occasionally experiences high blood sugar levels. She has been managing her diabetes for approximately 18 years and uses an insulin  pump and Dexcom for monitoring, which has helped lower her A1c levels.  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 She also notes a family history of similar urinary issues, as her daughter has experienced multiple urinary tract infections. She practices preventive measures  such as urinating after intercourse to reduce the risk of urinary tract infections.  No issues with incomplete bladder emptying, but she acknowledges frequent urination and ensures she empties her bladder completely. No history of yeast infections with antibiotic use.          09/29/2023   10:03 AM 08/04/2022   10:03 AM 12/27/2021    3:10 PM  Depression screen PHQ 2/9  Decreased Interest 1 1 1   Down, Depressed, Hopeless 1 1 1   PHQ - 2 Score 2 2 2   Altered sleeping 1 1 1   Tired, decreased energy 1 1 1   Change in appetite 1 1 1   Feeling bad or failure about yourself  1 0 1  Trouble concentrating 1 0 0  Moving slowly or fidgety/restless 0 1 0  Suicidal thoughts 0 0 0  PHQ-9 Score 7 6 6   Difficult doing work/chores Somewhat difficult Not difficult at all Not difficult at all       09/29/2023   10:03 AM 08/04/2022   10:04 AM 12/27/2021    3:10 PM 03/22/2021    8:58 AM  GAD 7 : Generalized Anxiety Score  Nervous, Anxious, on Edge 1 1 1 1   Control/stop worrying 1 1 0 0  Worry too much - different things 1 1 1  0  Trouble relaxing 1 0 1 0  Restless 0 0 0 0  Easily annoyed or irritable 1 1 1  0  Afraid - awful might happen 0 1 0 0  Total GAD 7 Score 5 5 4 1   Anxiety Difficulty Somewhat difficult Somewhat difficult  Not difficult at all  Social History   Tobacco Use   Smoking status: Never   Smokeless tobacco: Never  Vaping Use   Vaping status: Never Used  Substance Use Topics   Alcohol use: No   Drug use: No    Review of Systems Per HPI unless specifically indicated above     Objective:     BP 120/76 (BP Location: Left Arm, Patient Position: Sitting, Cuff Size: Normal)   Pulse 82   Ht 5\' 6"  (1.676 m)   Wt 189 lb 6 oz (85.9 kg)   SpO2 99%   BMI 30.57 kg/m   Wt Readings from Last 3 Encounters:  11/27/23 189 lb 6 oz (85.9 kg)  09/29/23 192 lb 3.2 oz (87.2 kg)  09/08/23 190 lb 6.4 oz (86.4 kg)    Physical Exam Vitals and nursing note reviewed.  Constitutional:       General: She is not in acute distress.    Appearance: Normal appearance. She is well-developed. She is not diaphoretic.     Comments: Well-appearing, comfortable, cooperative  HENT:     Head: Normocephalic and atraumatic.  Eyes:     General:        Right eye: No discharge.        Left eye: No discharge.     Conjunctiva/sclera: Conjunctivae normal.  Cardiovascular:     Rate and Rhythm: Normal rate.  Pulmonary:     Effort: Pulmonary effort is normal.  Skin:    General: Skin is warm and dry.     Findings: No erythema or rash.  Neurological:     Mental Status: She is alert and oriented to person, place, and time.  Psychiatric:        Mood and Affect: Mood normal.        Behavior: Behavior normal.        Thought Content: Thought content normal.     Comments: Well groomed, good eye contact, normal speech and thoughts     Results for orders placed or performed in visit on 11/27/23  POCT Urinalysis Dipstick   Collection Time: 11/27/23  2:00 PM  Result Value Ref Range   Color, UA yellow    Clarity, UA cloudy    Glucose, UA Negative Negative   Bilirubin, UA negative    Ketones, UA trace    Spec Grav, UA 1.015 1.010 - 1.025   Blood, UA large    pH, UA 5.0 5.0 - 8.0   Protein, UA Negative Negative   Urobilinogen, UA 0.2 0.2 or 1.0 E.U./dL   Nitrite, UA negative    Leukocytes, UA Large (3+) (A) Negative   Appearance     Odor positive       Assessment & Plan:   Problem List Items Addressed This Visit   None Visit Diagnoses       Acute cystitis with hematuria    -  Primary   Relevant Medications   cephALEXin  (KEFLEX ) 500 MG capsule     Dysuria       Relevant Orders   POCT Urinalysis Dipstick (Completed)   Urine Culture     Antibiotic-induced yeast infection       Relevant Medications   cephALEXin  (KEFLEX ) 500 MG capsule   fluconazole (DIFLUCAN) 150 MG tablet     Type 1 diabetes mellitus with hyperglycemia (HCC)         Long term current use of insulin  (HCC)            Urinary tract infection Recurrent UTI  with frequent urination and headache. Dipstick shows blood and leukocytes. Previous culture showed E. Coli by her report. However last documented UTI was inconclusive on urine culture, treated with Macrobid  09/2023 but it did resolve.  Consider bladder dysfunction or incomplete emptying, possibly some relation to diabetes hyperglycemia or nerve involvement  - Order urine culture for confirmation and sensitivities. - Prescribe Keflex  500 mg TID for 7 days. - Provide information on D-mannose for UTI prevention post-infection. - Print prescription for yeast infection treatment as precaution only, if need Diflucan - Discuss potential urologist referral if infections persist.  Type 1 diabetes mellitus Type 1 diabetes with occasional hyperglycemia. Uses insulin  pump and Dexcom. Discussed hyperglycemia's impact on UTIs and bladder function. Emphasized close glucose monitoring. - Continue insulin  pump and Dexcom. - Monitor blood glucose closely, especially with UTI symptoms.        Orders Placed This Encounter  Procedures   Urine Culture   POCT Urinalysis Dipstick    Meds ordered this encounter  Medications   cephALEXin  (KEFLEX ) 500 MG capsule    Sig: Take 1 capsule (500 mg total) by mouth 3 (three) times daily. For 7 days    Dispense:  21 capsule    Refill:  0   fluconazole (DIFLUCAN) 150 MG tablet    Sig: Take one tablet by mouth on Day 1. Repeat dose 2nd tablet on Day 3.    Dispense:  2 tablet    Refill:  0    Follow up plan: Return if symptoms worsen or fail to improve.   Domingo Friend, DO [facility] 11/27/2023, 2:15 PM

## 2023-11-28 ENCOUNTER — Ambulatory Visit: Admitting: Internal Medicine

## 2023-11-29 ENCOUNTER — Ambulatory Visit: Payer: Self-pay | Admitting: Family Medicine

## 2023-11-29 LAB — URINE CULTURE
MICRO NUMBER:: 16527409
SPECIMEN QUALITY:: ADEQUATE

## 2023-12-05 ENCOUNTER — Ambulatory Visit (INDEPENDENT_AMBULATORY_CARE_PROVIDER_SITE_OTHER): Admitting: Internal Medicine

## 2023-12-05 VITALS — BP 110/64 | Ht 66.0 in | Wt 191.6 lb

## 2023-12-05 DIAGNOSIS — E1065 Type 1 diabetes mellitus with hyperglycemia: Secondary | ICD-10-CM

## 2023-12-05 DIAGNOSIS — Z1211 Encounter for screening for malignant neoplasm of colon: Secondary | ICD-10-CM

## 2023-12-05 DIAGNOSIS — E66811 Obesity, class 1: Secondary | ICD-10-CM | POA: Diagnosis not present

## 2023-12-05 DIAGNOSIS — Z0001 Encounter for general adult medical examination with abnormal findings: Secondary | ICD-10-CM | POA: Diagnosis not present

## 2023-12-05 DIAGNOSIS — E6609 Other obesity due to excess calories: Secondary | ICD-10-CM

## 2023-12-05 DIAGNOSIS — Z6830 Body mass index (BMI) 30.0-30.9, adult: Secondary | ICD-10-CM

## 2023-12-05 NOTE — Patient Instructions (Signed)

## 2023-12-05 NOTE — Progress Notes (Signed)
 Subjective:    Patient ID: Ellen [name], female    DOB: February 02, 1976, 48 y.o.   MRN: [id number]  HPI  Patient presents to clinic today for her annual exam.  Flu: never Tetanus: unsure COVID: never Pneumovax: never Pap smear: 2024, [facility]: 05/2023 Colon screening: never Vision screening: annually, [facility]: biannually  Diet: She does eat meat. She consumes fruits and veggies. She does eat some fried foods. She drinks [place], water. Exercise: None  Review of Systems  Past Medical History:  Diagnosis Date   DM (diabetes mellitus), type 1 (HCC)    Frequent headaches    Hyperlipidemia     Current Outpatient Medications  Medication Sig Dispense Refill   buPROPion  ER (WELLBUTRIN  SR) 100 MG 12 hr tablet Take 1 tablet (100 mg total) by mouth daily. 30 tablet 0   cephALEXin  (KEFLEX ) 500 MG capsule Take 1 capsule (500 mg total) by mouth 3 (three) times daily. For 7 days 21 capsule 0   fluconazole  (DIFLUCAN ) 150 MG tablet Take one tablet by mouth on Day 1. Repeat dose 2nd tablet on Day 3. 2 tablet 0   Insulin  Disposable Pump (OMNIPOD 5 DEXG7G6 PODS GEN 5) MISC CHANGE POD EVERY 48 TO 72 HOURS 15 each 6   Insulin  Disposable Pump (OMNIPOD 5 G6 INTRO, GEN 5,) KIT Change pod every 48-72 hours 1 kit 0   insulin  lispro (HUMALOG ) 100 UNIT/ML injection Use with Omnipod for TDD around 67 units 60 mL 3   No current facility-administered medications for this visit.    No Known Allergies  Family History  Problem Relation Age of Onset   Hyperlipidemia Mother    Cancer Father        prostate   Arthritis Maternal Grandmother    Hyperlipidemia Maternal Grandmother    Hypertension Maternal Grandmother    Stroke Maternal Grandfather    Hypertension Maternal Grandfather    Stroke Paternal Grandmother    Diabetes Paternal Grandmother     Social History   Socioeconomic History   Marital status: Married    Spouse name: Not on file   Number of  children: Not on file   Years of education: Not on file   Highest education level: Not on file  Occupational History   Not on file  Tobacco Use   Smoking status: Never   Smokeless tobacco: Never  Vaping Use   Vaping status: Never Used  Substance and Sexual Activity   Alcohol use: No   Drug use: No   Sexual activity: Yes  Other Topics Concern   Not on file  Social History Narrative   Not on file   Social Drivers of Health   Financial Resource Strain: Not on file  Food Insecurity: Not on file  Transportation Needs: Not on file  Physical Activity: Not on file  Stress: Not on file  Social Connections: Not on file  Intimate Partner Violence: Not on file     Constitutional: Denies fever, malaise, fatigue, headache or abrupt weight changes.  HEENT: Denies eye pain, eye redness, ear pain, ringing in the ears, wax buildup, runny nose, nasal congestion, bloody nose, or sore throat. Respiratory: Denies difficulty breathing, shortness of breath, cough or sputum production.   Cardiovascular: Denies chest pain, chest tightness, palpitations or swelling in the hands or feet.  Gastrointestinal: Denies abdominal pain, bloating, constipation, diarrhea or blood in the stool.  GU: Denies urgency, frequency, pain with urination, burning sensation, blood in urine, odor or  discharge. Musculoskeletal: Pt reports bilateral knee and foot pain. Denies decrease in range of motion, difficulty with gait, muscle pain or joint swelling.  Skin: Denies redness, rashes, lesions or ulcercations.  Neurological: Denies dizziness, difficulty with memory, difficulty with speech or problems with balance and coordination.  Psych: Denies anxiety, depression, SI/HI.  No other specific complaints in a complete review of systems (except as listed in HPI above).     Objective:   Physical Exam BP 110/64 (BP Location: Right Arm, Patient Position: Sitting, Cuff Size: Normal)   Ht 5\' 6"  (1.676 m)   Wt 191 lb 9.6 oz  (86.9 kg)   LMP 11/19/2023 (Exact Date)   BMI 30.93 kg/m    Wt Readings from Last 3 Encounters:  11/27/23 189 lb 6 oz (85.9 kg)  09/29/23 192 lb 3.2 oz (87.2 kg)  09/08/23 190 lb 6.4 oz (86.4 kg)    General: Appears her stated age, obese, in NAD. Skin: Warm, dry and intact. No ulcerations noted. HEENT: Head: normal shape and size; Eyes: sclera white, no icterus, conjunctiva pink, PERRLA and EOMs intact;  Neck:  Neck supple, trachea midline. No masses, lumps or thyromegaly present.  Cardiovascular: Normal rate and rhythm. S1,S2 noted.  No murmur, rubs or gallops noted. No JVD or BLE edema.  Pulmonary/Chest: Normal effort and positive vesicular breath sounds. No respiratory distress. No wheezes, rales or ronchi noted.  Abdomen: Strength 5/5 BUE/BLE. No difficulty with gait.  Neurological: Alert and oriented. Cranial nerves II-XII grossly intact. Coordination normal.  Psychiatric: Mood and affect normal. Behavior is normal. Judgment and thought content normal.    BMET    Component Value Date/Time   NA 137 12/27/2021 1447   NA 140 03/21/2013 0000   K 4.6 12/27/2021 1447   CL 103 12/27/2021 1447   CO2 28 12/27/2021 1447   GLUCOSE 295 (H) 12/27/2021 1447   BUN 16 12/27/2021 1447   BUN 14 03/21/2013 0000   CREATININE 0.76 12/27/2021 1447   CALCIUM 9.1 12/27/2021 1447   GFRNONAA >60 10/18/2007 1053   GFRAA  10/18/2007 1053    >60        The eGFR has been calculated using the MDRD equation. This calculation has not been validated in all clinical    Lipid Panel     Component Value Date/Time   CHOL 186 12/27/2021 1447   TRIG 64 12/27/2021 1447   HDL 76 12/27/2021 1447   CHOLHDL 2.4 12/27/2021 1447   VLDL 8.6 04/07/2020 1126   LDLCALC 95 12/27/2021 1447    CBC    Component Value Date/Time   WBC 9.5 12/27/2021 1447   RBC 4.37 12/27/2021 1447   HGB 12.4 12/27/2021 1447   HCT 38.0 12/27/2021 1447   PLT 359 12/27/2021 1447   MCV 87.0 12/27/2021 1447   MCH 28.4  12/27/2021 1447   MCHC 32.6 12/27/2021 1447   RDW 12.5 12/27/2021 1447   LYMPHSABS 1.4 07/03/2019 1608   MONOABS 0.6 07/03/2019 1608   EOSABS 1.2 (H) 07/03/2019 1608   BASOSABS 0.1 07/03/2019 1608    Hgb A1C Lab Results  Component Value Date   HGBA1C 7.0 (A) 09/08/2023           Assessment & Plan:   Preventative Health Maintenance:  Encouraged her to get a flu shot in the fall She declines tetanus booster Encouraged her to get a COVID-vaccine She declines Pneumovax Pap smear UTD per her report, will request copy She prefers if GYN scheduled her mammogram Cologuard  ordered Encouraged her to consume a balanced diet and exercise regimen Advised her to see an eye doctor and dentist annually We will check CBC, c-Met, lipid, urine microalbumin  today  RTC in 1 year for your annual exam Helayne Lo, NP

## 2023-12-05 NOTE — Assessment & Plan Note (Signed)
 Encourage diet and exercise for weight loss

## 2023-12-06 ENCOUNTER — Ambulatory Visit: Payer: Self-pay | Admitting: Internal Medicine

## 2024-01-03 ENCOUNTER — Encounter: Payer: Self-pay | Admitting: Nurse Practitioner

## 2024-01-04 MED ORDER — DEXCOM G7 SENSOR MISC
1.0000 | 3 refills | Status: AC
Start: 2024-01-04 — End: ?

## 2024-01-08 ENCOUNTER — Telehealth: Payer: Self-pay | Admitting: Pharmacy Technician

## 2024-01-08 ENCOUNTER — Other Ambulatory Visit (HOSPITAL_COMMUNITY): Payer: Self-pay

## 2024-01-08 NOTE — Telephone Encounter (Signed)
 Pharmacy Patient Advocate Encounter   Received notification from CoverMyMeds that prior authorization for Dexcom G7 Sensor is required/requested.   Insurance verification completed.   The patient is insured through [facility] .   Per test claim: PA required; PA submitted to above mentioned insurance via CoverMyMeds Key/confirmation #/EOC [id number] Status is pending

## 2024-01-09 ENCOUNTER — Other Ambulatory Visit (HOSPITAL_COMMUNITY): Payer: Self-pay

## 2024-01-09 NOTE — Telephone Encounter (Signed)
 Pharmacy Patient Advocate Encounter  Received notification from [employer] that Prior Authorization for Dexcom G7 Sensor has been APPROVED from 01/08/24 to 01/07/25. Ran test claim, Copay is $125.00. This test claim was processed through [facility]- copay amounts may vary at other pharmacies due to pharmacy/plan contracts, or as the patient moves through the different stages of their insurance plan.   PA #/Case ID/Reference #: [id number]

## 2024-01-24 LAB — COLOGUARD: COLOGUARD: NEGATIVE

## 2024-03-15 ENCOUNTER — Encounter: Payer: Self-pay | Admitting: Nurse Practitioner

## 2024-03-15 ENCOUNTER — Ambulatory Visit: Admitting: Nurse Practitioner

## 2024-03-15 VITALS — BP 112/70 | HR 59 | Ht 66.0 in | Wt 192.2 lb

## 2024-03-15 DIAGNOSIS — F4323 Adjustment disorder with mixed anxiety and depressed mood: Secondary | ICD-10-CM | POA: Diagnosis not present

## 2024-03-15 DIAGNOSIS — E109 Type 1 diabetes mellitus without complications: Secondary | ICD-10-CM

## 2024-03-15 DIAGNOSIS — Z794 Long term (current) use of insulin: Secondary | ICD-10-CM | POA: Diagnosis not present

## 2024-03-15 LAB — POCT GLYCOSYLATED HEMOGLOBIN (HGB A1C): Hemoglobin A1C: 8 % — AB (ref 4.0–5.6)

## 2024-03-15 MED ORDER — INSULIN LISPRO 100 UNIT/ML IJ SOLN: INTRAMUSCULAR | 3 refills | Status: AC

## 2024-03-15 MED ORDER — OMNIPOD 5 DEXG7G6 PODS GEN 5 MISC: 6 refills | Status: DC

## 2024-03-15 NOTE — Progress Notes (Signed)
 Endocrinology Follow UpNote       03/15/2024, 11:07 AM   Subjective:    Patient ID: Ellen [name], female    DOB: 04/30/1976.  Ellen [name] is being seen in follow up after being seen in consultation for management of currently uncontrolled symptomatic diabetes requested by  Antonette Angeline ORN, NP.   Past Medical History:  Diagnosis Date   DM (diabetes mellitus), type 1 (HCC)    Frequent headaches    Hyperlipidemia     Past Surgical History:  Procedure Laterality Date   CESAREAN SECTION  7/04,9/07.4/09    Social History   Socioeconomic History   Marital status: Married    Spouse name: Not on file   Number of children: Not on file   Years of education: Not on file   Highest education level: Some college, no degree  Occupational History   Not on file  Tobacco Use   Smoking status: Never   Smokeless tobacco: Never  Vaping Use   Vaping status: Never Used  Substance and Sexual Activity   Alcohol use: No   Drug use: No   Sexual activity: Yes  Other Topics Concern   Not on file  Social History Narrative   Not on file   Social Drivers of Health   Financial Resource Strain: Low Risk  (12/05/2023)   Overall Financial Resource Strain (CARDIA)    Difficulty of Paying Living Expenses: Not very hard  Food Insecurity: No Food Insecurity (12/05/2023)   Hunger Vital Sign    Worried About Running Out of Food in the Last Year: Never true    Ran Out of Food in the Last Year: Never true  Transportation Needs: No Transportation Needs (12/05/2023)   PRAPARE - Administrator, Civil Service (Medical): No    Lack of Transportation (Non-Medical): No  Physical Activity: Insufficiently Active (12/05/2023)   Exercise Vital Sign    Days of Exercise per Week: 2 days    Minutes of Exercise per Session: 10 min  Stress: No Stress Concern Present (12/05/2023)   [employer] of Occupational Health -  Occupational Stress Questionnaire    Feeling of Stress : Only a little  Social Connections: Socially Integrated (12/05/2023)   Social Connection and Isolation Panel    Frequency of Communication with Friends and Family: More than three times a week    Frequency of Social Gatherings with Friends and Family: More than three times a week    Attends Religious Services: More than 4 times per year    Active Member of [employer] or Organizations: Yes    Attends Engineer, structural: More than 4 times per year    Marital Status: Married    Family History  Problem Relation Age of Onset   Hyperlipidemia Mother    Cancer Father        prostate   Arthritis Maternal Grandmother    Hyperlipidemia Maternal Grandmother    Hypertension Maternal Grandmother    Stroke Maternal Grandfather    Hypertension Maternal Grandfather    Stroke Paternal Grandmother    Diabetes Paternal Grandmother     Outpatient Encounter Medications as of 03/15/2024  Medication Sig  Continuous Glucose Sensor (DEXCOM G7 SENSOR) MISC Inject 1 Application into the skin as directed. Change sensor every 10 days as directed.   [DISCONTINUED] Insulin  Disposable Pump (OMNIPOD 5 DEXG7G6 PODS GEN 5) MISC CHANGE POD EVERY 48 TO 72 HOURS   [DISCONTINUED] insulin  lispro (HUMALOG ) 100 UNIT/ML injection Use with Omnipod for TDD around 67 units   Insulin  Disposable Pump (OMNIPOD 5 DEXG7G6 PODS GEN 5) MISC CHANGE POD EVERY 48 TO 72 HOURS   insulin  lispro (HUMALOG ) 100 UNIT/ML injection Use with Omnipod for TDD around 67 units   [DISCONTINUED] cephALEXin  (KEFLEX ) 500 MG capsule Take 1 capsule (500 mg total) by mouth 3 (three) times daily. For 7 days (Patient not taking: Reported on 03/15/2024)   No facility-administered encounter medications on file as of 03/15/2024.    ALLERGIES: No Known Allergies  VACCINATION STATUS: Immunization History  Administered Date(s) Administered   Influenza Split 04/27/2013   PPD Test 07/11/2017     Diabetes She presents for her follow-up diabetic visit. She has type 1 diabetes mellitus. Onset time: Diagnosed at approx age of 48. Her disease course has been stable. There are no hypoglycemic associated symptoms. There are no diabetic associated symptoms. There are no hypoglycemic complications. Symptoms are stable. There are no diabetic complications. Risk factors for coronary artery disease include diabetes mellitus. Current diabetic treatment includes insulin  pump. She is compliant with treatment all of the time. Her weight is fluctuating minimally. She is following a generally healthy diet. Meal planning includes avoidance of concentrated sweets and carbohydrate counting. She has had a previous visit with a dietitian. She participates in exercise intermittently. Her home blood glucose trend is fluctuating minimally. Her overall blood glucose range is 140-180 mg/dl. (She presents today with her CGM and Omnipod mostly at goal glycemic profile overall.  Her POCT A1c today is 8%, increasing from last visit of 7%.  Analysis of her CGM shows TIR 58%, TAR 40%, TBR 2% with a GMI of 7.4%.  She notes her sugar does tend to run high when in manual mode.  She is also interested in using the Omnipod 5 app.) An ACE inhibitor/angiotensin II receptor blocker is not being taken. She does not see a podiatrist.Eye exam is current.    Review of systems  Constitutional: + Minimally fluctuating body weight,  current Body mass index is 31.02 kg/m. , no fatigue, no subjective hyperthermia, no subjective hypothermia Eyes: no blurry vision, no xerophthalmia ENT: no sore throat, no nodules palpated in throat, no dysphagia/odynophagia, no hoarseness Cardiovascular: no chest pain, no shortness of breath, no palpitations, no leg swelling Respiratory: no cough, no shortness of breath Gastrointestinal: no nausea/vomiting/diarrhea Musculoskeletal: no muscle/joint aches Skin: no rashes, no hyperemia Neurological: no  tremors, no numbness, no tingling, no dizziness Psychiatric: no depression, no anxiety  Objective:     BP 112/70 (BP Location: Left Arm, Patient Position: Sitting, Cuff Size: Large)   Pulse (!) 59   Ht 5' 6 (1.676 m)   Wt 192 lb 3.2 oz (87.2 kg)   BMI 31.02 kg/m   Wt Readings from Last 3 Encounters:  03/15/24 192 lb 3.2 oz (87.2 kg)  12/05/23 191 lb 9.6 oz (86.9 kg)  11/27/23 189 lb 6 oz (85.9 kg)     BP Readings from Last 3 Encounters:  03/15/24 112/70  12/05/23 110/64  11/27/23 120/76      Physical Exam- Limited  Constitutional:  Body mass index is 31.02 kg/m. , not in acute distress, normal state of mind Eyes:  EOMI, no exophthalmos Musculoskeletal: no gross deformities, strength intact in all four extremities, no gross restriction of joint movements Skin:  no rashes, no hyperemia Neurological: no tremor with outstretched hands    CMP ( most recent) CMP     Component Value Date/Time   NA 138 12/05/2023 1330   NA 140 03/21/2013 0000   K 3.9 12/05/2023 1330   CL 104 12/05/2023 1330   CO2 28 12/05/2023 1330   GLUCOSE 232 (H) 12/05/2023 1330   BUN 11 12/05/2023 1330   BUN 14 03/21/2013 0000   CREATININE 0.63 12/05/2023 1330   CALCIUM 8.6 12/05/2023 1330   PROT 6.4 12/05/2023 1330   ALBUMIN 3.9 04/07/2020 1126   AST 13 12/05/2023 1330   ALT 10 12/05/2023 1330   ALKPHOS 62 04/07/2020 1126   BILITOT 0.5 12/05/2023 1330   GFRNONAA >60 10/18/2007 1053   GFRAA  10/18/2007 1053    >60        The eGFR has been calculated using the MDRD equation. This calculation has not been validated in all clinical     Diabetic Labs (most recent): Lab Results  Component Value Date   HGBA1C 8.0 (A) 03/15/2024   HGBA1C 7.0 (A) 09/08/2023   HGBA1C 7.5 (A) 03/10/2023   MICROALBUR 0.2 12/05/2023   MICROALBUR <0.2 12/27/2021     Lipid Panel ( most recent) Lipid Panel     Component Value Date/Time   CHOL 175 12/05/2023 1330   TRIG 62 12/05/2023 1330   HDL 70  12/05/2023 1330   CHOLHDL 2.5 12/05/2023 1330   VLDL 8.6 04/07/2020 1126   LDLCALC 91 12/05/2023 1330      Lab Results  Component Value Date   TSH 1.16 10/17/2018   TSH 1.51 10/17/2014   TSH 0.98 09/06/2013           Assessment & Plan:   1) Type 1 diabetes mellitus (HCC)  She presents today with her CGM and Omnipod mostly at goal glycemic profile overall.  Her POCT A1c today is 8%, increasing from last visit of 7%.  Analysis of her CGM shows TIR 58%, TAR 40%, TBR 2% with a GMI of 7.4%.  She notes her sugar does tend to run high when in manual mode.  She is also interested in using the Omnipod 5 app.  - Ellen [name] has currently uncontrolled symptomatic type 1 DM since 48 years of age.   -Recent labs reviewed.  - I had a long discussion with her about the progressive nature of diabetes and the pathology behind its complications. -her diabetes is not currently complicated but she remains at a high risk for more acute and chronic complications which include CAD, CVA, CKD, retinopathy, and neuropathy. These are all discussed in detail with her.  The following Lifestyle Medicine recommendations according to American College of Lifestyle Medicine [facility] were discussed and offered to patient and she agrees to start the journey:  A. Whole Foods, Plant-based plate comprising of fruits and vegetables, plant-based proteins, whole-grain carbohydrates was discussed in detail with the patient.   A list for source of those nutrients were also provided to the patient.  Patient will use only water or unsweetened tea for hydration. B.  The need to stay away from risky substances including alcohol, smoking; obtaining 7 to 9 hours of restorative sleep, at least 150 minutes of moderate intensity exercise weekly, the importance of healthy social connections,  and stress reduction techniques were discussed. C.  A full color page  of  Calorie density of various food groups per pound showing examples of  each food groups was provided to the patient.  - Nutritional counseling repeated at each appointment due to patients tendency to fall back in to old habits.  - The patient admits there is a room for improvement in their diet and drink choices. -  Suggestion is made for the patient to avoid simple carbohydrates from their diet including Cakes, Sweet Desserts / Pastries, Ice Cream, Soda (diet and regular), Sweet Tea, Candies, Chips, Cookies, Sweet Pastries, Store Bought Juices, Alcohol in Excess of 1-2 drinks a day, Artificial Sweeteners, Coffee Creamer, and Sugar-free Products. This will help patient to have stable blood glucose profile and potentially avoid unintended weight gain.   - I encouraged the patient to switch to unprocessed or minimally processed complex starch and increased protein intake (animal or plant source), fruits, and vegetables.   - Patient is advised to stick to a routine mealtimes to eat 3 meals a day and avoid unnecessary snacks (to snack only to correct hypoglycemia).  - I have approached her with the following individualized plan to manage her diabetes and patient agrees:   -Given her diagnosis of type 1 diabetes, insulin  is the exclusive choice of treatment for her.  She is interested in changing to Omnipod 5 app.  I did suggest making several setting adjustments when she does this.  Increase basal rate to 1 unit/hr, change target and correct above glucose to 120, and change max basal rate to 3 units per hour.  -she is encouraged to continue monitoring glucose 4 times daily, before meals and before bed and to call the clinic if she has readings less than 70 or above 200 for 3 tests in a row.  - she is warned not to take insulin  without proper monitoring per orders. - Adjustment parameters are given to her for hypo and hyperglycemia in writing.  - Specific targets for  A1c; LDL, HDL, and Triglycerides were discussed with the patient.  2) Blood Pressure  /Hypertension:  her blood pressure is controlled to target without the use of antihypertensive medications.   3) Lipids/Hyperlipidemia:    Review of her recent lipid panel from 12/05/23 showed controlled LDL at 91.  She is not currently on any lipid lowering medications.    4)  Weight/Diet:  her Body mass index is 31.02 kg/m.  -  clearly complicating her diabetes care.   she is a candidate for weight loss. I discussed with her the fact that loss of 5 - 10% of her  current body weight will have the most impact on her diabetes management.  Exercise, and detailed carbohydrates information provided  -  detailed on discharge instructions.  5) Chronic Care/Health Maintenance: -she is not on on ACEI/ARB or Statin medications and is encouraged to initiate and continue to follow up with Ophthalmology, Dentist, Podiatrist at least yearly or according to recommendations, and advised to stay away from smoking. I have recommended yearly flu vaccine and pneumonia vaccine at least every 5 years; moderate intensity exercise for up to 150 minutes weekly; and sleep for at least 7 hours a day.   - she is advised to maintain close follow up with Antonette Angeline ORN, NP for primary care needs, as well as her other providers for optimal and coordinated care.     I spent  47  minutes in the care of the patient today including review of labs from CMP, Lipids, Thyroid  Function, Hematology (current and  previous including abstractions from other facilities); face-to-face time discussing  her blood glucose readings/logs, discussing hypoglycemia and hyperglycemia episodes and symptoms, medications doses, her options of short and long term treatment based on the latest standards of care / guidelines;  discussion about incorporating lifestyle medicine;  and documenting the encounter. Risk reduction counseling performed per USPSTF guidelines to reduce obesity and cardiovascular risk factors.     Please refer to Patient Instructions  for Blood Glucose Monitoring and Insulin /Medications Dosing Guide  in media tab for additional information. Please  also refer to  Patient Self Inventory in the Media  tab for reviewed elements of pertinent patient history.  Lonell JAYSON Jaeger participated in the discussions, expressed understanding, and voiced agreement with the above plans.  All questions were answered to her satisfaction. she is encouraged to contact clinic should she have any questions or concerns prior to her return visit.     Follow up plan: - Return in about 6 months (around 09/12/2024) for Diabetes F/U with A1c in office, No previsit labs, Bring meter and logs.   Benton Rio, [facility] [address] [place], [place] [address] Phone: [phone] Fax: [phone]  03/15/2024, 11:07 AM

## 2024-03-22 ENCOUNTER — Encounter: Payer: Self-pay | Admitting: Nurse Practitioner

## 2024-05-03 LAB — COMPREHENSIVE METABOLIC PANEL WITH GFR
AG Ratio: 1.5 (calc) (ref 1.0–2.5)
ALT: 10 U/L (ref 6–29)
AST: 13 U/L (ref 10–35)
Albumin: 3.8 g/dL (ref 3.6–5.1)
Alkaline phosphatase (APISO): 64 U/L (ref 31–125)
BUN: 11 mg/dL (ref 7–25)
CO2: 28 mmol/L (ref 20–32)
Calcium: 8.6 mg/dL (ref 8.6–10.2)
Chloride: 104 mmol/L (ref 98–110)
Creat: 0.63 mg/dL (ref 0.50–0.99)
Globulin: 2.6 g/dL (ref 1.9–3.7)
Glucose, Bld: 232 mg/dL — ABNORMAL HIGH (ref 65–139)
Potassium: 3.9 mmol/L (ref 3.5–5.3)
Sodium: 138 mmol/L (ref 135–146)
Total Bilirubin: 0.5 mg/dL (ref 0.2–1.2)
Total Protein: 6.4 g/dL (ref 6.1–8.1)
eGFR: 111 mL/min/1.73m2 (ref >=60)

## 2024-05-03 LAB — CBC
HCT: 37.2 % (ref 35.0–45.0)
Hemoglobin: 11.9 g/dL (ref 11.7–15.5)
MCH: 28 pg (ref 27.0–33.0)
MCHC: 32 g/dL (ref 32.0–36.0)
MCV: 87.5 fL (ref 80.0–100.0)
MPV: 10.6 fL (ref 7.5–12.5)
Platelets: 320 Thousand/uL (ref 140–400)
RBC: 4.25 Million/uL (ref 3.80–5.10)
RDW: 13 % (ref 11.0–15.0)
WBC: 7.8 Thousand/uL (ref 3.8–10.8)

## 2024-05-03 LAB — LIPID PANEL
Cholesterol: 175 mg/dL (ref <200)
HDL: 70 mg/dL (ref >=50)
LDL Cholesterol (Calc): 91 mg/dL
Non-HDL Cholesterol (Calc): 105 mg/dL (ref <130)
Total CHOL/HDL Ratio: 2.5 (calc) (ref <5.0)
Triglycerides: 62 mg/dL (ref <150)

## 2024-05-03 LAB — MICROALBUMIN / CREATININE URINE RATIO
Creatinine, Urine: 61 mg/dL (ref 20–275)
Microalb Creat Ratio: 3 mg/g{creat} (ref <30)
Microalb, Ur: 0.2 mg/dL

## 2024-06-25 ENCOUNTER — Other Ambulatory Visit: Payer: Self-pay | Admitting: Nurse Practitioner

## 2024-07-19 ENCOUNTER — Encounter: Payer: Self-pay | Admitting: *Deleted

## 2024-07-19 ENCOUNTER — Telehealth: Payer: Self-pay | Admitting: *Deleted

## 2024-07-19 NOTE — Telephone Encounter (Signed)
 Patient left a message that she had been approved for the Dexcom Patient Assistance Program. They have told the patient that they have sent multiple request to us  to complete. We rec'd one and that was completed and sent yesterday.  Patient was sent a [employer] as if we had she was told to get the date and time that we sent it to them.

## 2024-09-13 ENCOUNTER — Ambulatory Visit: Admitting: Nurse Practitioner

## 2024-12-05 ENCOUNTER — Encounter: Admitting: Internal Medicine
